# Patient Record
Sex: Female | Born: 1980
Health system: Southern US, Community
[De-identification: ages and names within clinical notes are randomized; demographics above are authoritative.]

## PROBLEM LIST (undated history)

## (undated) DIAGNOSIS — E559 Vitamin D deficiency, unspecified: Secondary | ICD-10-CM

## (undated) DIAGNOSIS — E039 Hypothyroidism, unspecified: Secondary | ICD-10-CM

## (undated) HISTORY — PX: CHOLECYSTECTOMY: SHX55

## (undated) HISTORY — DX: Vitamin D deficiency, unspecified: E55.9

## (undated) HISTORY — PX: CERVICAL FUSION: SHX112

## (undated) HISTORY — DX: Hypothyroidism, unspecified: E03.9

---

## 1998-01-16 ENCOUNTER — Encounter: Admission: RE | Admit: 1998-01-16 | Discharge: 1998-01-16 | Payer: Self-pay | Admitting: Family Medicine

## 1998-02-20 ENCOUNTER — Inpatient Hospital Stay (HOSPITAL_COMMUNITY): Admission: AD | Admit: 1998-02-20 | Discharge: 1998-02-20 | Payer: Self-pay | Admitting: *Deleted

## 1998-02-21 ENCOUNTER — Ambulatory Visit (HOSPITAL_COMMUNITY): Admission: RE | Admit: 1998-02-21 | Discharge: 1998-02-21 | Payer: Self-pay | Admitting: *Deleted

## 1998-05-22 ENCOUNTER — Inpatient Hospital Stay (HOSPITAL_COMMUNITY): Admission: AD | Admit: 1998-05-22 | Discharge: 1998-05-22 | Payer: Self-pay | Admitting: *Deleted

## 1998-06-02 ENCOUNTER — Encounter (HOSPITAL_COMMUNITY): Admission: RE | Admit: 1998-06-02 | Discharge: 1998-08-22 | Payer: Self-pay | Admitting: Obstetrics & Gynecology

## 1998-07-16 ENCOUNTER — Observation Stay (HOSPITAL_COMMUNITY): Admission: AD | Admit: 1998-07-16 | Discharge: 1998-07-17 | Payer: Self-pay | Admitting: Obstetrics & Gynecology

## 1998-08-09 ENCOUNTER — Inpatient Hospital Stay (HOSPITAL_COMMUNITY): Admission: AD | Admit: 1998-08-09 | Discharge: 1998-08-09 | Payer: Self-pay | Admitting: Obstetrics

## 1998-08-22 ENCOUNTER — Inpatient Hospital Stay (HOSPITAL_COMMUNITY): Admission: AD | Admit: 1998-08-22 | Discharge: 1998-08-23 | Payer: Self-pay | Admitting: *Deleted

## 1999-05-01 ENCOUNTER — Ambulatory Visit (HOSPITAL_COMMUNITY): Admission: RE | Admit: 1999-05-01 | Discharge: 1999-05-01 | Payer: Self-pay | Admitting: *Deleted

## 1999-07-30 ENCOUNTER — Inpatient Hospital Stay (HOSPITAL_COMMUNITY): Admission: AD | Admit: 1999-07-30 | Discharge: 1999-07-30 | Payer: Self-pay | Admitting: *Deleted

## 1999-09-02 ENCOUNTER — Inpatient Hospital Stay (HOSPITAL_COMMUNITY): Admission: AD | Admit: 1999-09-02 | Discharge: 1999-09-02 | Payer: Self-pay | Admitting: *Deleted

## 1999-09-03 ENCOUNTER — Inpatient Hospital Stay (HOSPITAL_COMMUNITY): Admission: AD | Admit: 1999-09-03 | Discharge: 1999-09-03 | Payer: Self-pay | Admitting: Obstetrics & Gynecology

## 1999-09-03 ENCOUNTER — Encounter: Payer: Self-pay | Admitting: Obstetrics & Gynecology

## 1999-09-15 ENCOUNTER — Inpatient Hospital Stay (HOSPITAL_COMMUNITY): Admission: AD | Admit: 1999-09-15 | Discharge: 1999-09-15 | Payer: Self-pay | Admitting: *Deleted

## 1999-09-17 ENCOUNTER — Inpatient Hospital Stay (HOSPITAL_COMMUNITY): Admission: AD | Admit: 1999-09-17 | Discharge: 1999-09-19 | Payer: Self-pay | Admitting: *Deleted

## 1999-09-17 ENCOUNTER — Encounter (HOSPITAL_COMMUNITY): Admission: RE | Admit: 1999-09-17 | Discharge: 1999-09-18 | Payer: Self-pay | Admitting: Obstetrics & Gynecology

## 2000-06-08 ENCOUNTER — Emergency Department (HOSPITAL_COMMUNITY): Admission: EM | Admit: 2000-06-08 | Discharge: 2000-06-09 | Payer: Self-pay | Admitting: Emergency Medicine

## 2000-06-09 ENCOUNTER — Encounter: Payer: Self-pay | Admitting: Emergency Medicine

## 2000-08-16 ENCOUNTER — Emergency Department (HOSPITAL_COMMUNITY): Admission: EM | Admit: 2000-08-16 | Discharge: 2000-08-16 | Payer: Self-pay | Admitting: Emergency Medicine

## 2000-10-20 ENCOUNTER — Emergency Department (HOSPITAL_COMMUNITY): Admission: EM | Admit: 2000-10-20 | Discharge: 2000-10-21 | Payer: Self-pay | Admitting: Emergency Medicine

## 2000-11-10 ENCOUNTER — Encounter: Payer: Self-pay | Admitting: Emergency Medicine

## 2000-11-10 ENCOUNTER — Emergency Department (HOSPITAL_COMMUNITY): Admission: EM | Admit: 2000-11-10 | Discharge: 2000-11-10 | Payer: Self-pay | Admitting: Emergency Medicine

## 2001-01-06 ENCOUNTER — Emergency Department (HOSPITAL_COMMUNITY): Admission: EM | Admit: 2001-01-06 | Discharge: 2001-01-06 | Payer: Self-pay | Admitting: Emergency Medicine

## 2001-01-06 ENCOUNTER — Encounter: Payer: Self-pay | Admitting: Emergency Medicine

## 2001-04-14 ENCOUNTER — Emergency Department (HOSPITAL_COMMUNITY): Admission: EM | Admit: 2001-04-14 | Discharge: 2001-04-14 | Payer: Self-pay | Admitting: Internal Medicine

## 2001-11-17 ENCOUNTER — Emergency Department (HOSPITAL_COMMUNITY): Admission: EM | Admit: 2001-11-17 | Discharge: 2001-11-17 | Payer: Self-pay | Admitting: *Deleted

## 2001-11-18 ENCOUNTER — Emergency Department (HOSPITAL_COMMUNITY): Admission: EM | Admit: 2001-11-18 | Discharge: 2001-11-18 | Payer: Self-pay | Admitting: Emergency Medicine

## 2002-01-13 ENCOUNTER — Emergency Department (HOSPITAL_COMMUNITY): Admission: EM | Admit: 2002-01-13 | Discharge: 2002-01-13 | Payer: Self-pay | Admitting: Emergency Medicine

## 2002-01-13 ENCOUNTER — Encounter: Payer: Self-pay | Admitting: Emergency Medicine

## 2002-01-16 ENCOUNTER — Emergency Department (HOSPITAL_COMMUNITY): Admission: EM | Admit: 2002-01-16 | Discharge: 2002-01-16 | Payer: Self-pay | Admitting: Emergency Medicine

## 2002-01-16 ENCOUNTER — Encounter: Payer: Self-pay | Admitting: Emergency Medicine

## 2002-04-21 ENCOUNTER — Emergency Department (HOSPITAL_COMMUNITY): Admission: EM | Admit: 2002-04-21 | Discharge: 2002-04-21 | Payer: Self-pay | Admitting: Emergency Medicine

## 2002-05-25 ENCOUNTER — Emergency Department (HOSPITAL_COMMUNITY): Admission: EM | Admit: 2002-05-25 | Discharge: 2002-05-25 | Payer: Self-pay | Admitting: Emergency Medicine

## 2002-10-20 ENCOUNTER — Encounter: Payer: Self-pay | Admitting: *Deleted

## 2002-10-20 ENCOUNTER — Emergency Department (HOSPITAL_COMMUNITY): Admission: EM | Admit: 2002-10-20 | Discharge: 2002-10-20 | Payer: Self-pay | Admitting: *Deleted

## 2002-10-26 ENCOUNTER — Encounter: Admission: RE | Admit: 2002-10-26 | Discharge: 2002-10-26 | Payer: Self-pay | Admitting: Family Medicine

## 2002-11-02 ENCOUNTER — Encounter: Admission: RE | Admit: 2002-11-02 | Discharge: 2002-11-02 | Payer: Self-pay | Admitting: Family Medicine

## 2002-11-03 ENCOUNTER — Encounter: Admission: RE | Admit: 2002-11-03 | Discharge: 2002-11-03 | Payer: Self-pay | Admitting: Family Medicine

## 2002-11-04 ENCOUNTER — Encounter: Admission: RE | Admit: 2002-11-04 | Discharge: 2002-11-04 | Payer: Self-pay | Admitting: Family Medicine

## 2003-01-19 ENCOUNTER — Emergency Department (HOSPITAL_COMMUNITY): Admission: EM | Admit: 2003-01-19 | Discharge: 2003-01-19 | Payer: Self-pay | Admitting: Emergency Medicine

## 2003-01-21 ENCOUNTER — Emergency Department (HOSPITAL_COMMUNITY): Admission: EM | Admit: 2003-01-21 | Discharge: 2003-01-21 | Payer: Self-pay | Admitting: Emergency Medicine

## 2003-01-24 ENCOUNTER — Encounter: Admission: RE | Admit: 2003-01-24 | Discharge: 2003-01-24 | Payer: Self-pay | Admitting: Family Medicine

## 2003-06-16 ENCOUNTER — Encounter: Payer: Self-pay | Admitting: Obstetrics & Gynecology

## 2003-06-16 ENCOUNTER — Inpatient Hospital Stay (HOSPITAL_COMMUNITY): Admission: AD | Admit: 2003-06-16 | Discharge: 2003-06-16 | Payer: Self-pay | Admitting: Obstetrics & Gynecology

## 2003-06-18 ENCOUNTER — Inpatient Hospital Stay (HOSPITAL_COMMUNITY): Admission: AD | Admit: 2003-06-18 | Discharge: 2003-06-18 | Payer: Self-pay | Admitting: Obstetrics & Gynecology

## 2003-07-09 ENCOUNTER — Encounter: Payer: Self-pay | Admitting: *Deleted

## 2003-07-09 ENCOUNTER — Inpatient Hospital Stay (HOSPITAL_COMMUNITY): Admission: AD | Admit: 2003-07-09 | Discharge: 2003-07-09 | Payer: Self-pay | Admitting: *Deleted

## 2003-08-24 ENCOUNTER — Ambulatory Visit (HOSPITAL_COMMUNITY): Admission: RE | Admit: 2003-08-24 | Discharge: 2003-08-24 | Payer: Self-pay | Admitting: *Deleted

## 2003-09-21 ENCOUNTER — Ambulatory Visit (HOSPITAL_COMMUNITY): Admission: RE | Admit: 2003-09-21 | Discharge: 2003-09-21 | Payer: Self-pay | Admitting: *Deleted

## 2003-12-20 ENCOUNTER — Ambulatory Visit (HOSPITAL_COMMUNITY): Admission: RE | Admit: 2003-12-20 | Discharge: 2003-12-20 | Payer: Self-pay | Admitting: *Deleted

## 2004-01-23 ENCOUNTER — Emergency Department (HOSPITAL_COMMUNITY): Admission: EM | Admit: 2004-01-23 | Discharge: 2004-01-23 | Payer: Self-pay | Admitting: Family Medicine

## 2004-02-09 ENCOUNTER — Observation Stay (HOSPITAL_COMMUNITY): Admission: AD | Admit: 2004-02-09 | Discharge: 2004-02-10 | Payer: Self-pay | Admitting: Family Medicine

## 2004-02-13 ENCOUNTER — Inpatient Hospital Stay (HOSPITAL_COMMUNITY): Admission: AD | Admit: 2004-02-13 | Discharge: 2004-02-13 | Payer: Self-pay | Admitting: Obstetrics and Gynecology

## 2004-02-17 ENCOUNTER — Inpatient Hospital Stay (HOSPITAL_COMMUNITY): Admission: AD | Admit: 2004-02-17 | Discharge: 2004-02-19 | Payer: Self-pay | Admitting: Obstetrics & Gynecology

## 2004-03-12 ENCOUNTER — Inpatient Hospital Stay (HOSPITAL_COMMUNITY): Admission: AD | Admit: 2004-03-12 | Discharge: 2004-03-12 | Payer: Self-pay | Admitting: Obstetrics & Gynecology

## 2004-03-20 ENCOUNTER — Encounter: Admission: RE | Admit: 2004-03-20 | Discharge: 2004-03-20 | Payer: Self-pay | Admitting: Obstetrics and Gynecology

## 2004-04-02 IMAGING — US US OB FOLLOW-UP
1 series · 13 of 28 positions shown · non-contrast
Comparison: none

CLINICAL DATA: G4 P3.  LMP 05/15/03.  Evaluate anatomy.

[Series 1: unknown · 0.35mm/px · 13 of 70 slices shown]
[im 3/70]
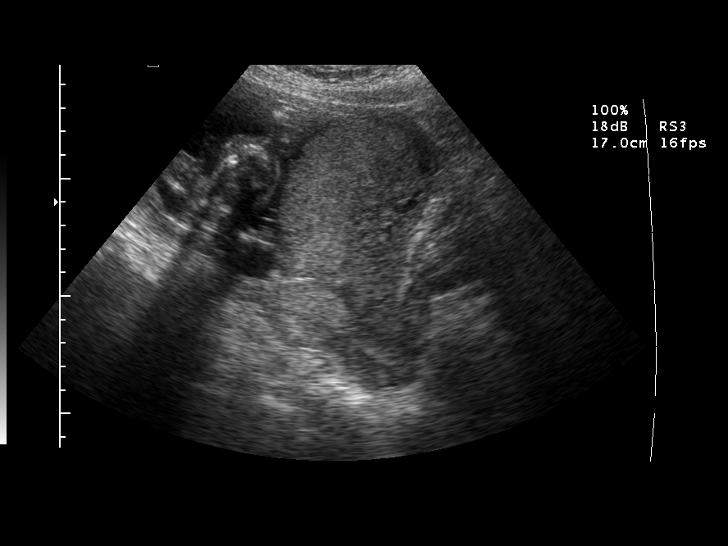
[im 8/70]
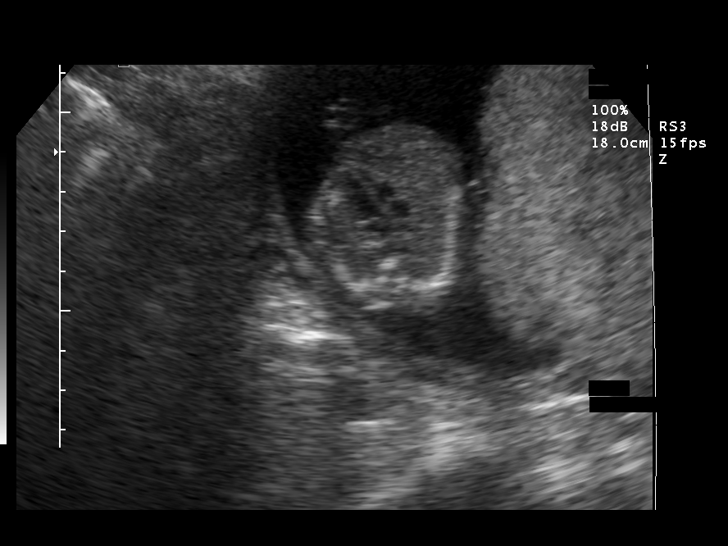
[im 13/70]
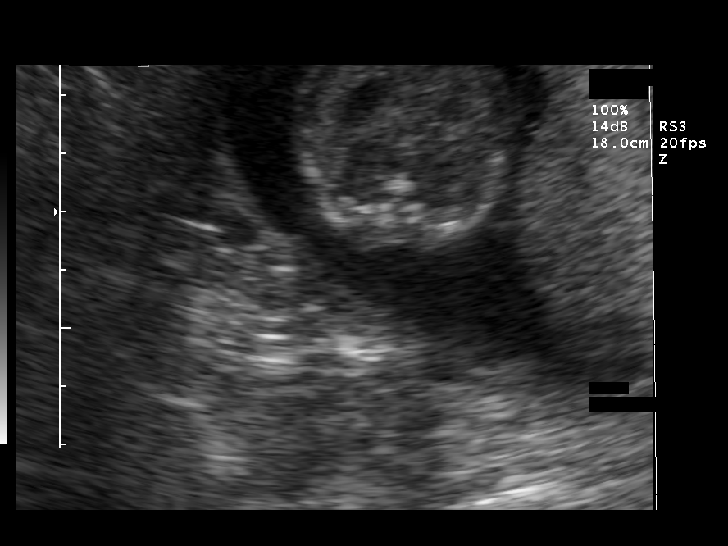
[im 18/70]
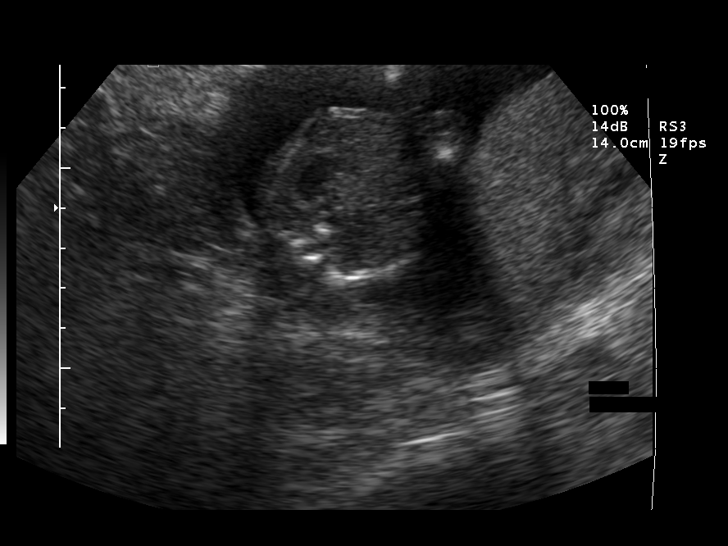
[im 24/70]
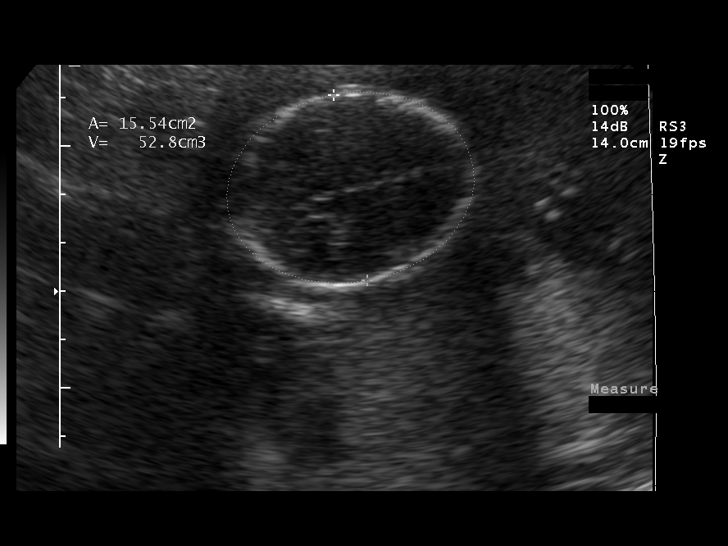
[im 29/70]
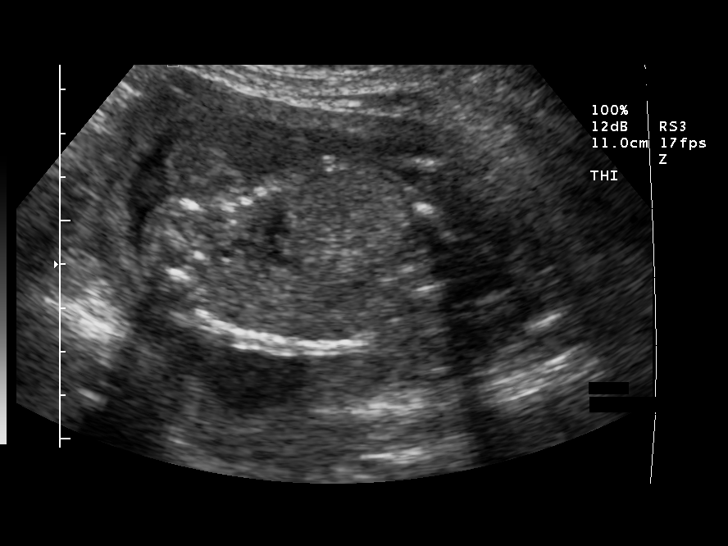
[im 36/70]
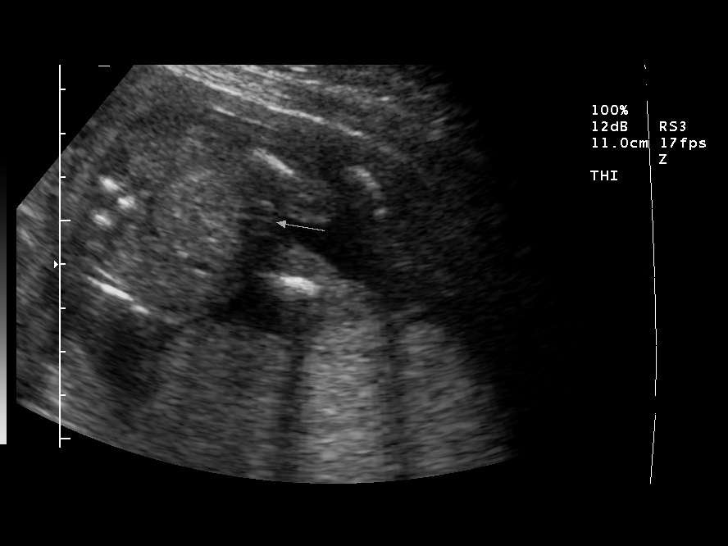
[im 41/70]
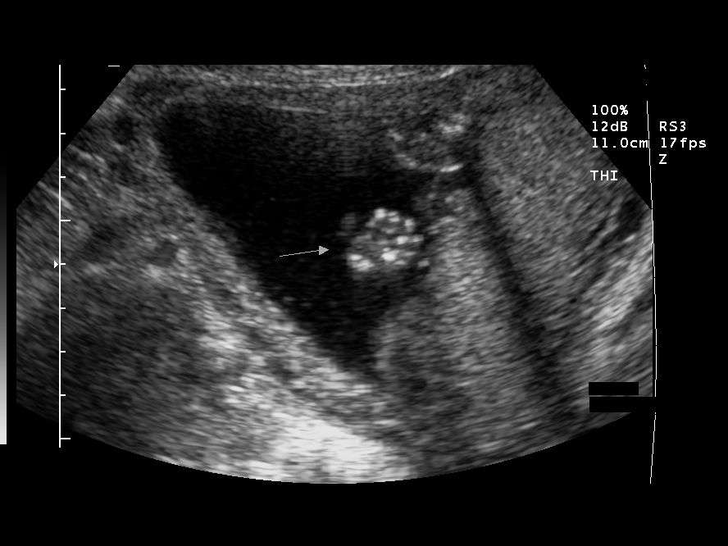
[im 47/70]
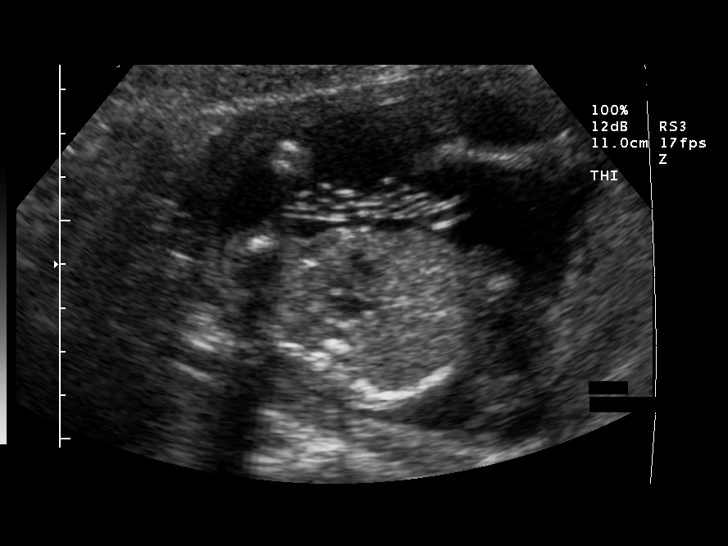
[im 52/70]
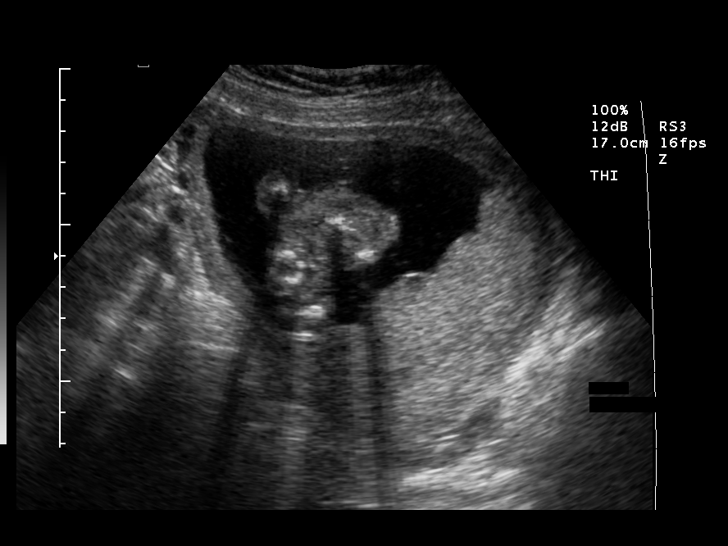
[im 57/70]
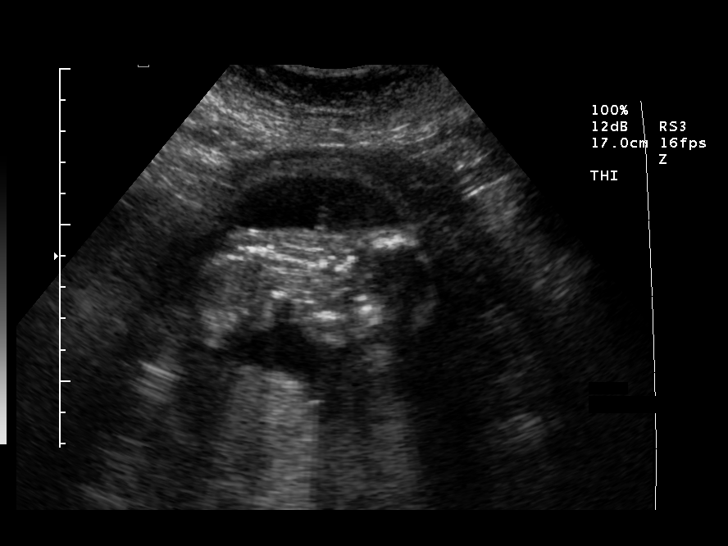
[im 62/70]
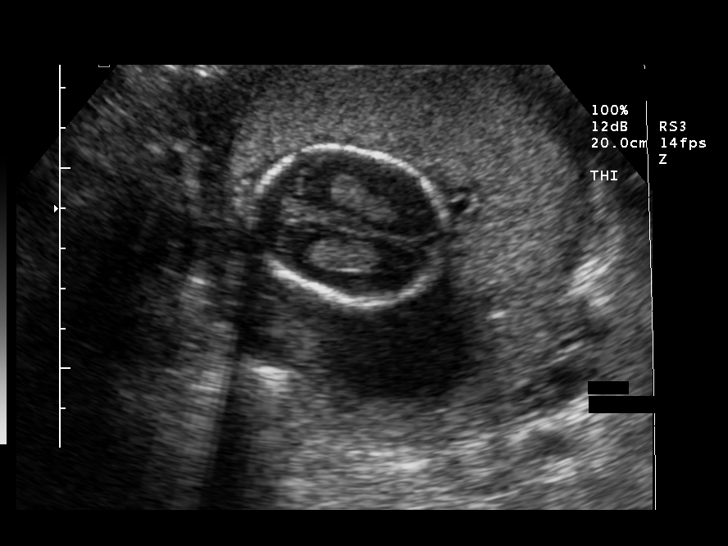
[im 67/70]
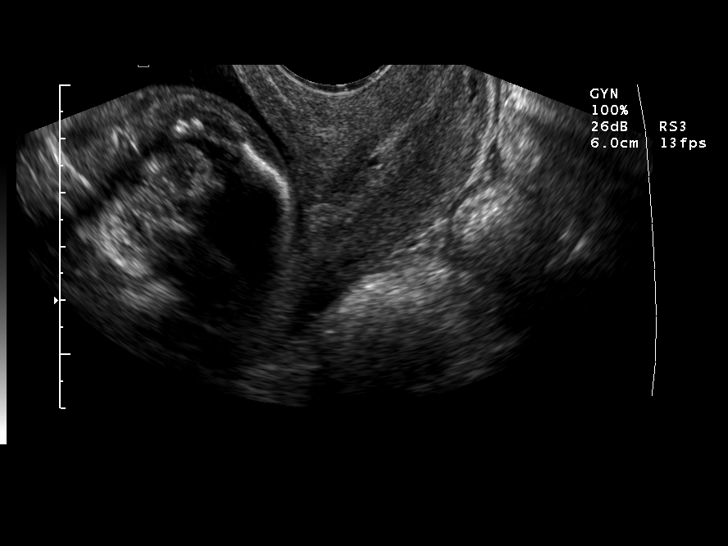

[13 of 28 positions shown; findings below may reference images not displayed]

OBSTETRICAL ULTRASOUND RE-EVALUATION WITH TRANSVAGINAL

NUMBER OF FETUSES:  1
HEART RATE:  135
MOVEMENT:  Yes
BREATHING:  No
PRESENTATION:
PLACENTAL LOCATION:  Anterior, left lateral 
GRADE:  I
PREVIA:  No
AMNIOTIC FLUID (subjective):  Normal
AMNIOTIC FLUID (objective):  4.2 cm Vertical pocket 

FETAL BIOMETRY
BPD:  3.9 cm  17 w 5 d
HC:  14.6 cm  17 w 5 d 
AC:   13.6 cm   19 w 0 d
FL:  2.5 cm   17 w 5 d
MEAN GA:  18 w 0 d
GA BY LMP;  18 w 3 d (Assigned)

FETAL ANATOMY
LATERAL VENTRICLES:    Visualized 
THALAMI/CSP:  Visualized 
POSTERIOR FOSSA:  Visualized 
NUCHAL REGION:  Visualized 
SPINE:  
4 CHAMBER HEART ON LEFT:  Visualized 
STOMACH ON LEFT:  Visualized 
3 VESSEL CORD:  Visualized 
CORD INSERTION SITE:  Visualized 
KIDNEYS:  Visualized 
BLADDER:    Visualized 
EXTREMITIES:  Previously seen 

ADDITIONAL ANATOMY VISUALIZED:  RVOT, upper lip, orbits, profile, diaphragm, heel, 5th digit, ductal arch, aortic arch, and female genitalia

MATERNAL FINDINGS
CERVIX:  4.0 cm Transvaginally
IMPRESSION: Single living intrauterine fetus in transverse lie.  Patient is 18 weeks 3 days by LMP dating and measures 18 weeks today indicating appropriate growth.
Longitudinal cervical spine and LVOT not visualized.  Otherwise, no anatomic abnormality noted.

## 2004-06-13 ENCOUNTER — Emergency Department (HOSPITAL_COMMUNITY): Admission: EM | Admit: 2004-06-13 | Discharge: 2004-06-13 | Payer: Self-pay | Admitting: Family Medicine

## 2004-06-14 ENCOUNTER — Ambulatory Visit (HOSPITAL_COMMUNITY): Admission: RE | Admit: 2004-06-14 | Discharge: 2004-06-14 | Payer: Self-pay | Admitting: Family Medicine

## 2004-08-02 ENCOUNTER — Ambulatory Visit: Payer: Self-pay | Admitting: Endocrinology

## 2004-08-02 ENCOUNTER — Ambulatory Visit: Payer: Self-pay | Admitting: Internal Medicine

## 2004-08-15 ENCOUNTER — Emergency Department (HOSPITAL_COMMUNITY): Admission: EM | Admit: 2004-08-15 | Discharge: 2004-08-15 | Payer: Self-pay | Admitting: Family Medicine

## 2004-09-17 ENCOUNTER — Ambulatory Visit: Payer: Self-pay | Admitting: Endocrinology

## 2004-09-17 ENCOUNTER — Ambulatory Visit: Payer: Self-pay | Admitting: Internal Medicine

## 2004-10-24 ENCOUNTER — Ambulatory Visit: Payer: Self-pay | Admitting: Endocrinology

## 2004-12-12 ENCOUNTER — Ambulatory Visit: Payer: Self-pay | Admitting: Endocrinology

## 2004-12-18 ENCOUNTER — Ambulatory Visit: Payer: Self-pay | Admitting: Internal Medicine

## 2004-12-19 ENCOUNTER — Ambulatory Visit (HOSPITAL_COMMUNITY): Admission: RE | Admit: 2004-12-19 | Discharge: 2004-12-19 | Payer: Self-pay | Admitting: Internal Medicine

## 2004-12-25 ENCOUNTER — Ambulatory Visit: Payer: Self-pay | Admitting: Internal Medicine

## 2005-01-04 ENCOUNTER — Ambulatory Visit: Payer: Self-pay | Admitting: Internal Medicine

## 2005-01-09 ENCOUNTER — Ambulatory Visit: Payer: Self-pay | Admitting: Internal Medicine

## 2005-02-05 ENCOUNTER — Ambulatory Visit: Payer: Self-pay | Admitting: Internal Medicine

## 2005-02-26 ENCOUNTER — Emergency Department (HOSPITAL_COMMUNITY): Admission: EM | Admit: 2005-02-26 | Discharge: 2005-02-26 | Payer: Self-pay | Admitting: Family Medicine

## 2005-04-04 ENCOUNTER — Ambulatory Visit: Payer: Self-pay | Admitting: Internal Medicine

## 2005-04-28 ENCOUNTER — Emergency Department (HOSPITAL_COMMUNITY): Admission: EM | Admit: 2005-04-28 | Discharge: 2005-04-28 | Payer: Self-pay | Admitting: Family Medicine

## 2005-04-30 ENCOUNTER — Inpatient Hospital Stay (HOSPITAL_COMMUNITY): Admission: AD | Admit: 2005-04-30 | Discharge: 2005-04-30 | Payer: Self-pay | Admitting: Obstetrics & Gynecology

## 2005-06-28 ENCOUNTER — Ambulatory Visit: Payer: Self-pay | Admitting: Internal Medicine

## 2005-07-08 ENCOUNTER — Emergency Department (HOSPITAL_COMMUNITY): Admission: EM | Admit: 2005-07-08 | Discharge: 2005-07-08 | Payer: Self-pay | Admitting: Emergency Medicine

## 2005-07-09 ENCOUNTER — Ambulatory Visit: Payer: Self-pay | Admitting: Internal Medicine

## 2005-08-11 ENCOUNTER — Inpatient Hospital Stay (HOSPITAL_COMMUNITY): Admission: AD | Admit: 2005-08-11 | Discharge: 2005-08-11 | Payer: Self-pay | Admitting: Obstetrics and Gynecology

## 2005-08-12 ENCOUNTER — Ambulatory Visit: Payer: Self-pay | Admitting: Internal Medicine

## 2005-08-13 ENCOUNTER — Encounter: Admission: RE | Admit: 2005-08-13 | Discharge: 2005-08-13 | Payer: Self-pay | Admitting: Internal Medicine

## 2005-09-10 ENCOUNTER — Ambulatory Visit: Payer: Self-pay | Admitting: Internal Medicine

## 2005-11-18 ENCOUNTER — Ambulatory Visit: Payer: Self-pay | Admitting: Internal Medicine

## 2005-11-23 ENCOUNTER — Emergency Department (HOSPITAL_COMMUNITY): Admission: EM | Admit: 2005-11-23 | Discharge: 2005-11-23 | Payer: Self-pay | Admitting: Family Medicine

## 2005-11-29 ENCOUNTER — Ambulatory Visit: Payer: Self-pay | Admitting: Internal Medicine

## 2005-12-10 ENCOUNTER — Encounter: Admission: RE | Admit: 2005-12-10 | Discharge: 2005-12-10 | Payer: Self-pay | Admitting: Internal Medicine

## 2005-12-29 ENCOUNTER — Emergency Department (HOSPITAL_COMMUNITY): Admission: EM | Admit: 2005-12-29 | Discharge: 2005-12-29 | Payer: Self-pay | Admitting: Emergency Medicine

## 2006-01-14 ENCOUNTER — Ambulatory Visit: Payer: Self-pay | Admitting: Endocrinology

## 2006-02-13 ENCOUNTER — Encounter: Payer: Self-pay | Admitting: Obstetrics and Gynecology

## 2006-02-13 ENCOUNTER — Ambulatory Visit: Payer: Self-pay | Admitting: Obstetrics and Gynecology

## 2006-02-21 ENCOUNTER — Ambulatory Visit: Payer: Self-pay | Admitting: Internal Medicine

## 2006-03-24 ENCOUNTER — Ambulatory Visit: Payer: Self-pay | Admitting: Internal Medicine

## 2006-05-08 ENCOUNTER — Ambulatory Visit: Payer: Self-pay | Admitting: Endocrinology

## 2006-07-15 ENCOUNTER — Ambulatory Visit: Payer: Self-pay | Admitting: Endocrinology

## 2006-07-30 ENCOUNTER — Emergency Department (HOSPITAL_COMMUNITY): Admission: EM | Admit: 2006-07-30 | Discharge: 2006-07-30 | Payer: Self-pay | Admitting: Family Medicine

## 2006-08-19 ENCOUNTER — Ambulatory Visit: Payer: Self-pay | Admitting: Internal Medicine

## 2006-09-10 ENCOUNTER — Emergency Department (HOSPITAL_COMMUNITY): Admission: EM | Admit: 2006-09-10 | Discharge: 2006-09-10 | Payer: Self-pay | Admitting: Family Medicine

## 2006-09-10 ENCOUNTER — Ambulatory Visit: Payer: Self-pay | Admitting: Internal Medicine

## 2006-09-10 LAB — CONVERTED CEMR LAB
ALT: 23 units/L (ref 0–40)
AST: 33 units/L (ref 0–37)
Amylase: 66 units/L (ref 27–131)
BUN: 12 mg/dL (ref 6–23)
Bacteria, U Microscopic: NEGATIVE /hpf
Basophils Absolute: 0 10*3/uL (ref 0.0–0.1)
Basophils Relative: 0.5 % (ref 0.0–1.0)
Bilirubin Urine: NEGATIVE
Creatinine, Ser: 0.8 mg/dL (ref 0.4–1.2)
Crystals: NEGATIVE
Eosinophil percent: 2.2 % (ref 0.0–5.0)
Glucose, Bld: 73 mg/dL (ref 70–99)
HCT: 34.2 % — ABNORMAL LOW (ref 36.0–46.0)
Hemoglobin, Urine: NEGATIVE
Hemoglobin: 11.9 g/dL — ABNORMAL LOW (ref 12.0–15.0)
Ketones, ur: NEGATIVE mg/dL
Lipase: 34 units/L (ref 11.0–59.0)
Lymphocytes Relative: 20.6 % (ref 12.0–46.0)
MCHC: 34.8 g/dL (ref 30.0–36.0)
MCV: 85.3 fL (ref 78.0–100.0)
Monocytes Absolute: 0.4 10*3/uL (ref 0.2–0.7)
Monocytes Relative: 5.8 % (ref 3.0–11.0)
Mucus, UA: NEGATIVE
Neutro Abs: 5.3 10*3/uL (ref 1.4–7.7)
Neutrophils Relative %: 70.9 % (ref 43.0–77.0)
Nitrite: NEGATIVE
Platelets: 210 10*3/uL (ref 150–400)
Potassium: 3.6 meq/L (ref 3.5–5.1)
RBC / HPF: NONE SEEN
RBC: 4.01 M/uL (ref 3.87–5.11)
RDW: 11.7 % (ref 11.5–14.6)
Sodium: 139 meq/L (ref 135–145)
Specific Gravity, Urine: 1.01 (ref 1.000–1.03)
TSH: 4.95 microintl units/mL (ref 0.35–5.50)
Total Protein, Urine: NEGATIVE mg/dL
Urine Glucose: NEGATIVE mg/dL
Urobilinogen, UA: 0.2 (ref 0.0–1.0)
WBC: 7.4 10*3/uL (ref 4.5–10.5)
pH: 7 (ref 5.0–8.0)

## 2006-09-18 ENCOUNTER — Ambulatory Visit: Payer: Self-pay | Admitting: Gastroenterology

## 2006-10-10 ENCOUNTER — Emergency Department (HOSPITAL_COMMUNITY): Admission: EM | Admit: 2006-10-10 | Discharge: 2006-10-10 | Payer: Self-pay | Admitting: Emergency Medicine

## 2006-10-20 ENCOUNTER — Ambulatory Visit: Payer: Self-pay | Admitting: Internal Medicine

## 2006-11-27 DIAGNOSIS — F410 Panic disorder [episodic paroxysmal anxiety] without agoraphobia: Secondary | ICD-10-CM

## 2006-11-27 DIAGNOSIS — F411 Generalized anxiety disorder: Secondary | ICD-10-CM | POA: Insufficient documentation

## 2007-01-22 ENCOUNTER — Ambulatory Visit: Payer: Self-pay | Admitting: Internal Medicine

## 2007-02-19 ENCOUNTER — Ambulatory Visit: Payer: Self-pay | Admitting: Internal Medicine

## 2007-02-19 LAB — CONVERTED CEMR LAB
ALT: 17 units/L (ref 0–40)
AST: 17 units/L (ref 0–37)
Albumin: 3.8 g/dL (ref 3.5–5.2)
Alkaline Phosphatase: 48 units/L (ref 39–117)
BUN: 7 mg/dL (ref 6–23)
Basophils Absolute: 0.1 10*3/uL (ref 0.0–0.1)
Basophils Relative: 1.1 % — ABNORMAL HIGH (ref 0.0–1.0)
Bilirubin, Direct: 0.1 mg/dL (ref 0.0–0.3)
CO2: 30 meq/L (ref 19–32)
Calcium: 9.1 mg/dL (ref 8.4–10.5)
Chloride: 108 meq/L (ref 96–112)
Creatinine, Ser: 0.9 mg/dL (ref 0.4–1.2)
Eosinophils Absolute: 0.3 10*3/uL (ref 0.0–0.6)
Eosinophils Relative: 4 % (ref 0.0–5.0)
GFR calc Af Amer: 97 mL/min
GFR calc non Af Amer: 80 mL/min
Glucose, Bld: 87 mg/dL (ref 70–99)
HCT: 36.6 % (ref 36.0–46.0)
Hemoglobin: 12.5 g/dL (ref 12.0–15.0)
Lymphocytes Relative: 25.3 % (ref 12.0–46.0)
MCHC: 34.3 g/dL (ref 30.0–36.0)
MCV: 86.2 fL (ref 78.0–100.0)
Monocytes Absolute: 0.3 10*3/uL (ref 0.2–0.7)
Monocytes Relative: 5.3 % (ref 3.0–11.0)
Neutro Abs: 4.2 10*3/uL (ref 1.4–7.7)
Neutrophils Relative %: 64.3 % (ref 43.0–77.0)
Platelets: 189 10*3/uL (ref 150–400)
Potassium: 3.8 meq/L (ref 3.5–5.1)
RBC: 4.24 M/uL (ref 3.87–5.11)
RDW: 12 % (ref 11.5–14.6)
Sodium: 142 meq/L (ref 135–145)
TSH: 5.18 microintl units/mL (ref 0.35–5.50)
Total Bilirubin: 0.7 mg/dL (ref 0.3–1.2)
Total Protein: 7 g/dL (ref 6.0–8.3)
WBC: 6.6 10*3/uL (ref 4.5–10.5)

## 2007-02-24 ENCOUNTER — Emergency Department (HOSPITAL_COMMUNITY): Admission: EM | Admit: 2007-02-24 | Discharge: 2007-02-24 | Payer: Self-pay | Admitting: Family Medicine

## 2007-03-12 ENCOUNTER — Encounter: Payer: Self-pay | Admitting: Endocrinology

## 2007-04-15 ENCOUNTER — Ambulatory Visit: Payer: Self-pay | Admitting: Internal Medicine

## 2007-05-04 ENCOUNTER — Emergency Department (HOSPITAL_COMMUNITY): Admission: EM | Admit: 2007-05-04 | Discharge: 2007-05-04 | Payer: Self-pay | Admitting: Emergency Medicine

## 2007-05-12 ENCOUNTER — Encounter: Payer: Self-pay | Admitting: Endocrinology

## 2007-05-12 DIAGNOSIS — F988 Other specified behavioral and emotional disorders with onset usually occurring in childhood and adolescence: Secondary | ICD-10-CM | POA: Insufficient documentation

## 2007-05-21 ENCOUNTER — Ambulatory Visit: Payer: Self-pay | Admitting: Internal Medicine

## 2007-05-29 ENCOUNTER — Ambulatory Visit (HOSPITAL_COMMUNITY): Admission: RE | Admit: 2007-05-29 | Discharge: 2007-05-29 | Payer: Self-pay | Admitting: Surgery

## 2007-05-29 ENCOUNTER — Encounter (INDEPENDENT_AMBULATORY_CARE_PROVIDER_SITE_OTHER): Payer: Self-pay | Admitting: Surgery

## 2007-06-25 ENCOUNTER — Ambulatory Visit: Payer: Self-pay | Admitting: Internal Medicine

## 2007-06-27 DIAGNOSIS — K219 Gastro-esophageal reflux disease without esophagitis: Secondary | ICD-10-CM | POA: Insufficient documentation

## 2007-07-10 ENCOUNTER — Encounter: Payer: Self-pay | Admitting: Internal Medicine

## 2007-07-10 ENCOUNTER — Ambulatory Visit: Payer: Self-pay | Admitting: Internal Medicine

## 2007-07-10 DIAGNOSIS — F329 Major depressive disorder, single episode, unspecified: Secondary | ICD-10-CM

## 2007-07-10 DIAGNOSIS — E039 Hypothyroidism, unspecified: Secondary | ICD-10-CM | POA: Insufficient documentation

## 2007-07-10 DIAGNOSIS — N61 Mastitis without abscess: Secondary | ICD-10-CM | POA: Insufficient documentation

## 2007-07-22 ENCOUNTER — Telehealth: Payer: Self-pay | Admitting: Internal Medicine

## 2007-08-24 ENCOUNTER — Telehealth: Payer: Self-pay | Admitting: Internal Medicine

## 2007-09-14 ENCOUNTER — Ambulatory Visit: Payer: Self-pay | Admitting: Internal Medicine

## 2007-09-14 DIAGNOSIS — J45909 Unspecified asthma, uncomplicated: Secondary | ICD-10-CM | POA: Insufficient documentation

## 2007-09-14 DIAGNOSIS — J069 Acute upper respiratory infection, unspecified: Secondary | ICD-10-CM | POA: Insufficient documentation

## 2007-09-25 ENCOUNTER — Emergency Department (HOSPITAL_COMMUNITY): Admission: EM | Admit: 2007-09-25 | Discharge: 2007-09-25 | Payer: Self-pay | Admitting: Emergency Medicine

## 2007-10-05 ENCOUNTER — Encounter: Payer: Self-pay | Admitting: Internal Medicine

## 2007-11-12 ENCOUNTER — Encounter: Payer: Self-pay | Admitting: Internal Medicine

## 2007-11-25 ENCOUNTER — Telehealth (INDEPENDENT_AMBULATORY_CARE_PROVIDER_SITE_OTHER): Payer: Self-pay | Admitting: *Deleted

## 2007-12-01 ENCOUNTER — Encounter: Payer: Self-pay | Admitting: Internal Medicine

## 2007-12-09 IMAGING — RF DG CHOLANGIOGRAM OPERATIVE
1 series · 4 of 4 positions shown · non-contrast
Comparison: abdomen ultrasound 09/18/2006.

CLINICAL DATA: Chronic cholecystitis.
INTRAOPERATIVE CHOLANGIOGRAM:

[Series 1: run · 4 of 61 frames shown]
[frame 2/61]
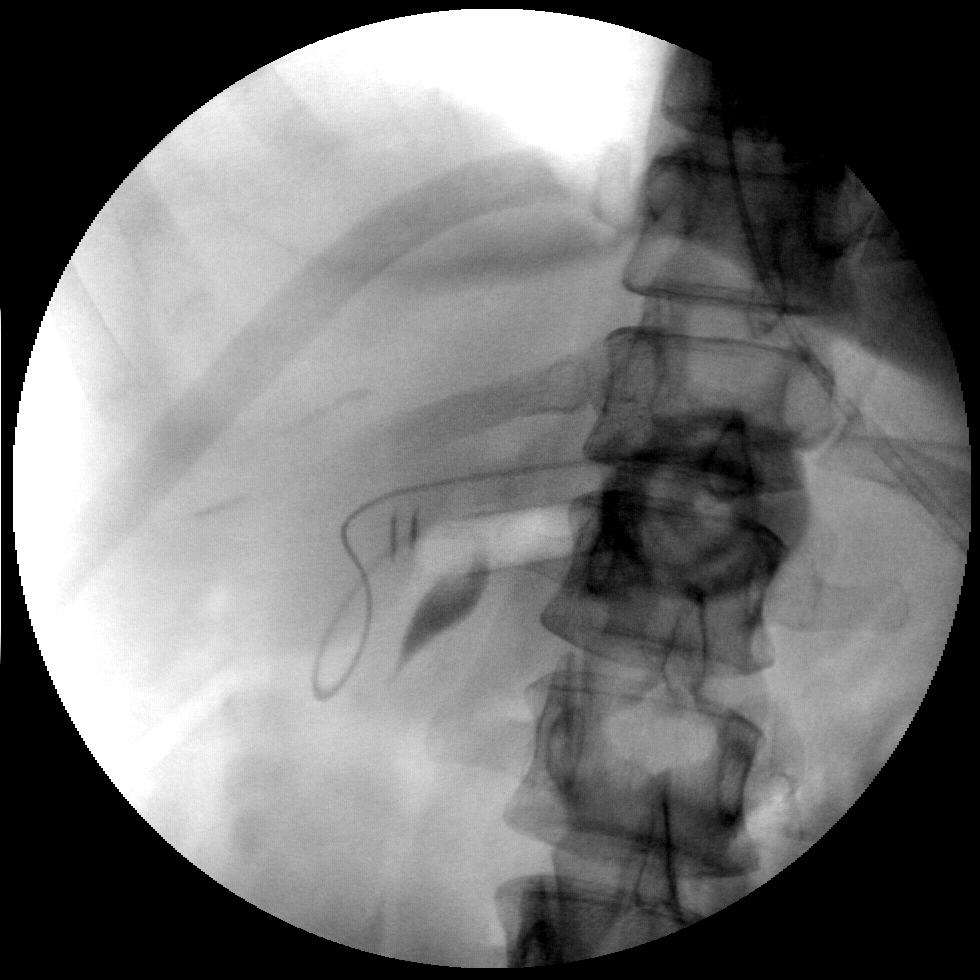
[frame 10/61]
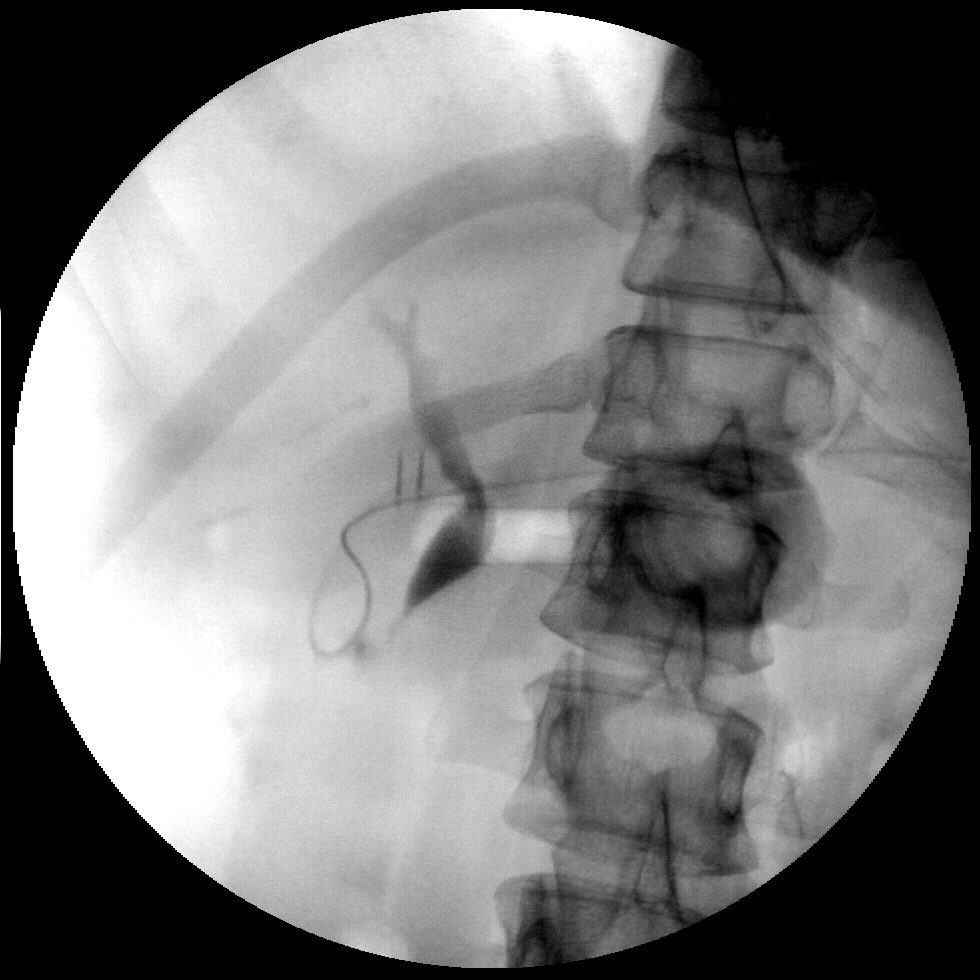
[frame 31/61]
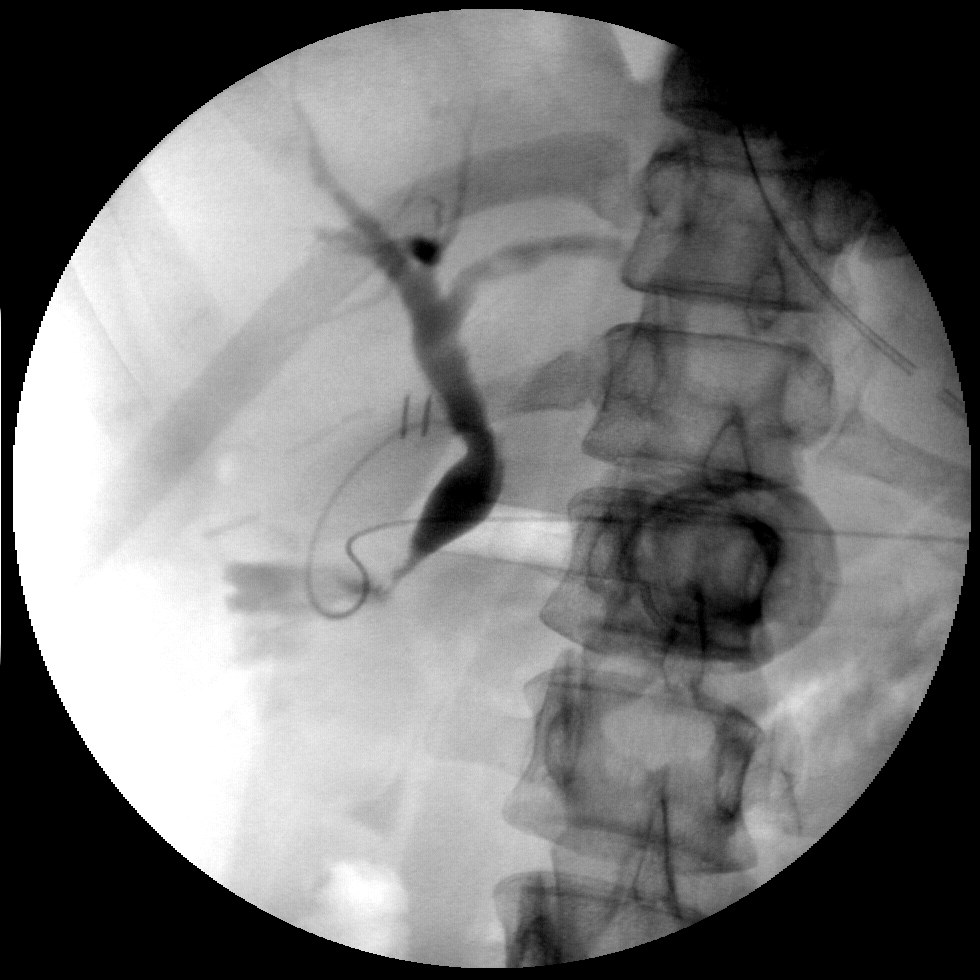
[frame 52/61]
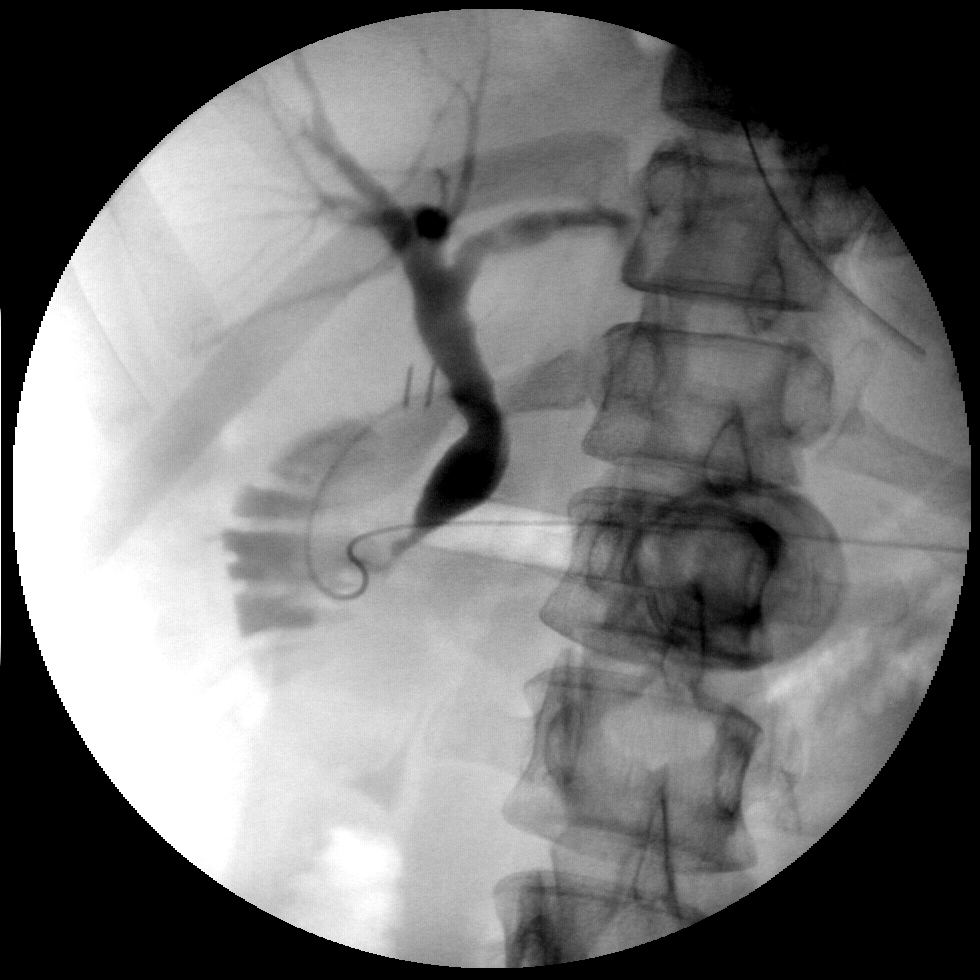

[4 of 4 positions shown; findings below may reference images not displayed]

FINDINGS: The study is somewhat motion limited by breathing artifact.  Mild fusiform prominence of the common bile duct appears unchanged from the prior ultrasound.  There is drainage into the duodenum and no extravasation.  No retained calculi are demonstrated.  A suggested lucency of the left intrahepatic ducts appears to be due to overlapping ducts but is not well evaluated due to breathing and cardiac motion.
IMPRESSION: No evidence of ductal obstruction or retained calculi.

## 2007-12-24 ENCOUNTER — Telehealth: Payer: Self-pay | Admitting: Internal Medicine

## 2007-12-25 ENCOUNTER — Encounter: Payer: Self-pay | Admitting: Internal Medicine

## 2008-01-19 ENCOUNTER — Encounter: Admission: RE | Admit: 2008-01-19 | Discharge: 2008-01-19 | Payer: Self-pay | Admitting: Family Medicine

## 2008-02-01 ENCOUNTER — Telehealth: Payer: Self-pay | Admitting: Internal Medicine

## 2008-02-02 ENCOUNTER — Encounter: Payer: Self-pay | Admitting: Internal Medicine

## 2008-02-05 ENCOUNTER — Ambulatory Visit: Payer: Self-pay | Admitting: Internal Medicine

## 2008-02-05 DIAGNOSIS — J209 Acute bronchitis, unspecified: Secondary | ICD-10-CM

## 2008-02-05 DIAGNOSIS — R21 Rash and other nonspecific skin eruption: Secondary | ICD-10-CM | POA: Insufficient documentation

## 2009-08-13 ENCOUNTER — Emergency Department (HOSPITAL_COMMUNITY): Admission: EM | Admit: 2009-08-13 | Discharge: 2009-08-13 | Payer: Self-pay | Admitting: Emergency Medicine

## 2009-08-30 ENCOUNTER — Emergency Department (HOSPITAL_COMMUNITY): Admission: EM | Admit: 2009-08-30 | Discharge: 2009-08-30 | Payer: Self-pay | Admitting: Family Medicine

## 2010-03-12 IMAGING — CR DG TIBIA/FIBULA 2V*R*
2 series · 2 of 2 positions shown · non-contrast
Comparison: None

CLINICAL DATA: Not on lower leg, no acute injury

RIGHT TIBIA AND FIBULA - 2 VIEW

[view not recorded (1 of 2)]
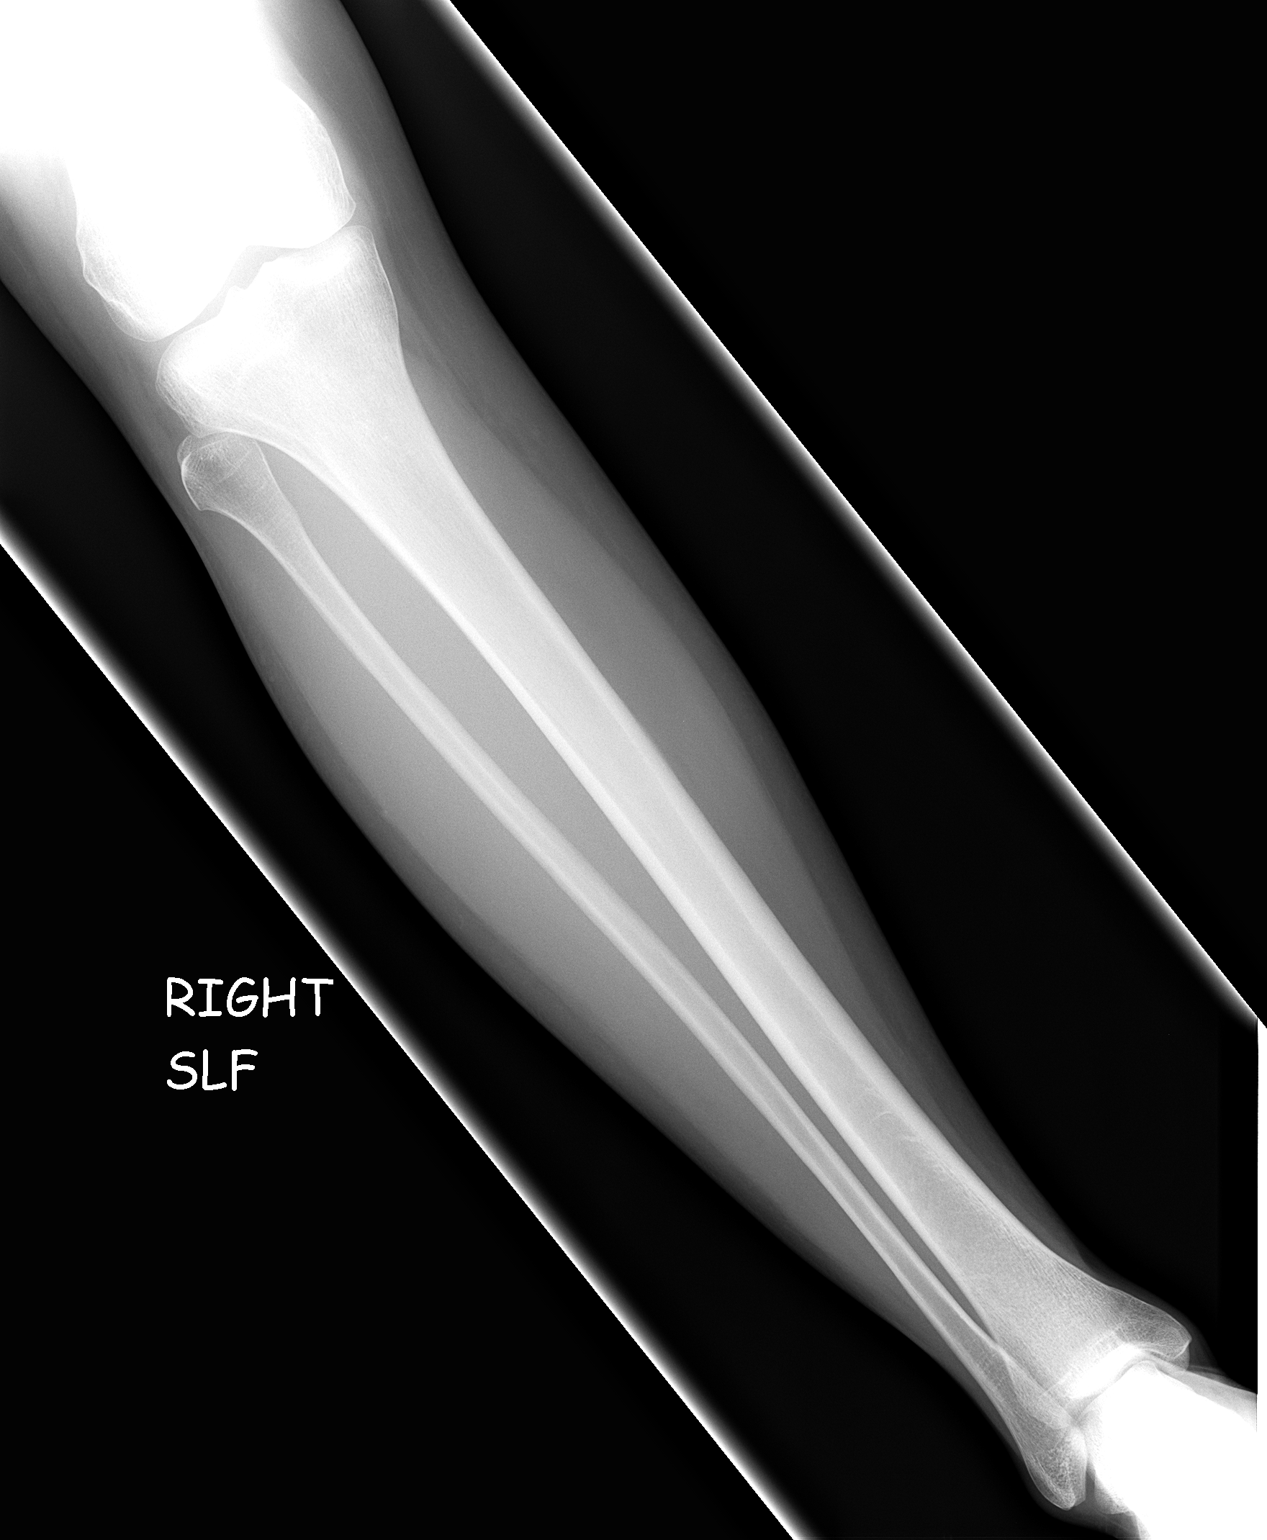

[view not recorded (2 of 2)]
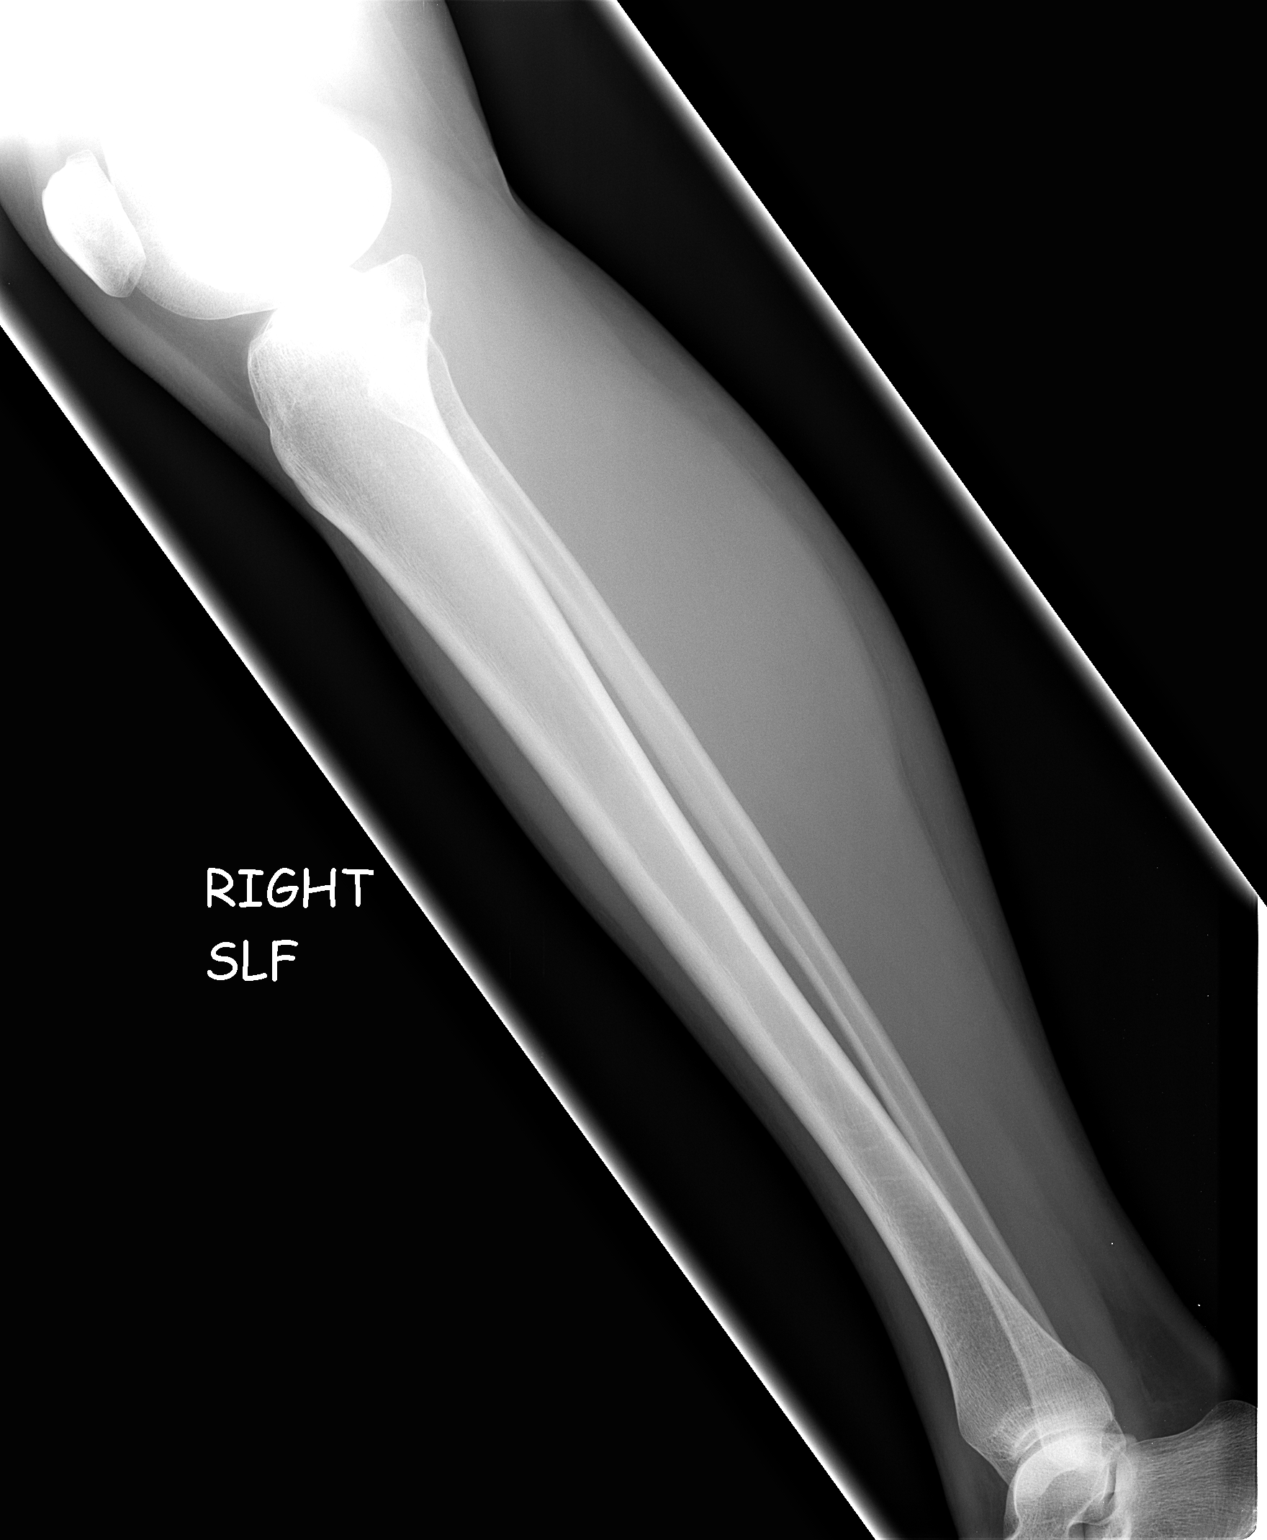

[2 of 2 positions shown; findings below may reference images not displayed]

FINDINGS: No bony abnormality is seen.  Alignment is normal.  The
knee joint and ankle joint appear grossly normal.
IMPRESSION: No bony abnormality.

## 2010-10-20 ENCOUNTER — Encounter: Payer: Self-pay | Admitting: Internal Medicine

## 2010-10-21 ENCOUNTER — Encounter: Payer: Self-pay | Admitting: Endocrinology

## 2010-10-21 ENCOUNTER — Encounter: Payer: Self-pay | Admitting: Internal Medicine

## 2010-10-21 ENCOUNTER — Encounter: Payer: Self-pay | Admitting: *Deleted

## 2010-10-21 ENCOUNTER — Encounter: Payer: Self-pay | Admitting: Family Medicine

## 2011-01-01 LAB — DIFFERENTIAL
Basophils Absolute: 0.1 10*3/uL (ref 0.0–0.1)
Basophils Relative: 1 % (ref 0–1)
Eosinophils Absolute: 0.2 10*3/uL (ref 0.0–0.7)
Eosinophils Relative: 3 % (ref 0–5)
Lymphocytes Relative: 23 % (ref 12–46)
Lymphs Abs: 1.6 10*3/uL (ref 0.7–4.0)
Monocytes Absolute: 0.3 10*3/uL (ref 0.1–1.0)
Monocytes Relative: 4 % (ref 3–12)
Neutro Abs: 4.9 10*3/uL (ref 1.7–7.7)
Neutrophils Relative %: 69 % (ref 43–77)

## 2011-01-01 LAB — CBC
HCT: 38.3 % (ref 36.0–46.0)
Hemoglobin: 13.4 g/dL (ref 12.0–15.0)
MCHC: 35 g/dL (ref 30.0–36.0)
MCV: 90.6 fL (ref 78.0–100.0)
Platelets: 166 10*3/uL (ref 150–400)
RBC: 4.23 MIL/uL (ref 3.87–5.11)
RDW: 12.9 % (ref 11.5–15.5)
WBC: 7.1 10*3/uL (ref 4.0–10.5)

## 2011-01-01 LAB — TSH: TSH: 7.874 u[IU]/mL — ABNORMAL HIGH (ref 0.350–4.500)

## 2011-02-12 NOTE — Op Note (Signed)
NAME:  Judith Castaneda, Judith Castaneda                 ACCOUNT NO.:  [id number]   MEDICAL RECORD NO.:  [id number]          PATIENT TYPE:  AMB   LOCATION:  DAY                          FACILITY:  [facility]   PHYSICIAN:  Currie Paris, M.D.DATE OF BIRTH:  1981/03/11   DATE OF PROCEDURE:  05/29/2007  DATE OF DISCHARGE:                               OPERATIVE REPORT   CLINICAL HISTORY:  Judith Castaneda is a 30 year old lady who I saw back in  February when we discussed doing a cholecystectomy on her and had  initially tentatively scheduled it.  However, she postponed it until  more recently and then added it on.  I have not actually seen her since  February.  However, she has had multiple episodes in the interval of  abdominal pain typical for biliary colic.  She apparently had had an  ultrasound done back when I saw her that showed a thickened gallbladder  wall but no stones.  Reportedly, no stones seen on ultrasound prior to  that.  She actually had been into the emergency room and then seen early  in August for another episode of this pain.   DESCRIPTION OF PROCEDURE:  The patient was seen in the short stay unit.  I reviewed the interval history and did another physical.  In addition  to her current symptoms, she had had a small inframammary fold abscess  that had been draining and was almost healed.  She has had a history of  recurring infections.   The patient was taken to the operating room.  After satisfactory general  endotracheal anesthesia had been obtained, the abdomen was prepped and  draped.  A time out was done.  I used 0.25% plain Marcaine for each  incision.  The umbilical incision was made, the fascia opened, and the  peritoneal cavity entered under direct vision.  A pursestring was  placed, the [place] introduced, and the abdomen insufflated 15.  The  patient was placed in reversed Trendelenburg and tilted to the left.  A  10/11 trocar was placed in the epigastrium and two 5 trocars  laterally.   The gallbladder was noted to be somewhat sclerotic appearing.  There was  an obvious stone in the very top of the gallbladder and another one that  appeared to be wedged at the neck of the gallbladder.  However, she had  what looked like only chronic inflammation.  I was able to easily see  the cystic duct as the patient is very thin.  With some retraction on  the gallbladder, I opened the peritoneum over the cystic duct common  duct area and dissected out the cystic duct.  Initially, I could not  really see the cystic artery, although I had a nice window behind the  cystic duct.  I could see a vessel coursing up along the side of the  gallbladder but this looked too large to be a cystic artery.   At this point, I put a clip on the cystic duct at its junction with the  gallbladder and opened it.  A Cook catheter was introduced and  intraoperative cholangiogram done.  Although the common duct was a  little bit larger than average, there was no stones, no filling defects,  and good filling of the hepatic radicals.  The cystic duct catheter was  removed and three clips placed on the stay side of the cystic duct and  it was divided.  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  With downward and lateral traction on the gallbladder, I  was able to put these branches on tension, clip them and divide them  working my way along the bottom third of the gallbladder up towards the  top.  Once I got to that point, then we had almost a mesentery that we  were able to use the cautery to divide, but as noted, there were  multiple small branches of arteries coming off this relatively large  vessel that was partially covered by liver but running right along the  edge of the gallbladder.  Once the gallbladder was disconnected, it  was  placed in a bag, I irrigated to make sure everything was dry.  The  gallbladder was brought out the umbilical port.   We reinsufflated to make sure everything was dry.  There was no evidence  of bile or bleeding.  The lateral ports were removed.  The abdomen was  deflated through the epigastric port.  The pursestring was used to close  the  umbilical port prior to deflating the abdomen.  The skin was closed with  4-0 Monocryl subcuticular plus Dermabond.  The patient tolerated the  procedure well and there were no operative complications.  All counts  were correct.      Currie Paris, M.D.  Electronically Signed     CJS/MEDQ  D:  05/29/2007  T:  05/30/2007  Job:  [id number]   cc:   Georgina Quint. Plotnikov, MD  [address]  [place]  [place] [address]

## 2011-02-12 NOTE — Assessment & Plan Note (Signed)
[facility]                           PRIMARY CARE OFFICE NOTE   RICA, HEATHER                        MRN:          [id number]  DATE:05/21/2007                            DOB:          [birth year]    PROCEDURE:  Abscess incision and drainage.   REASON:  A 3 x 1.5-cm subcutaneous abscess below right breast anterior  chest wall.      Risks including incomplete procedure, bleeding, infection, scar  formation, others, as well the benefits explained to the patient in  detail and she agreed to proceed.   She was placed in the decubitus position where the abscess area was  prepped with Betadine and alcohol and injected with 1.5 mL of 2%  lidocaine with epinephrine.  A straight blade incision along the skin  line was made, 1 cm, with a small amount of pus extracted.  The abscess  cavity was probed with a dull forceps.  About 1 inch of Iodoform gauze  was inserted in the cavity, Telfa pad with antibiotic ointment, paper  tape.  Wound instructions provided.  Tolerated well.  Complications:  None.  Follow up with me in 1-2 weeks.     Georgina Quint. Plotnikov, MD  Electronically Signed    AVP/MedQ  DD: 05/27/2007  DT: 05/28/2007  Job #: [id number]

## 2011-02-15 NOTE — Group Therapy Note (Signed)
NAME:  Judith Castaneda, Judith Castaneda                           ACCOUNT NO.:  [id number]   MEDICAL RECORD NO.:  [id number]                   PATIENT TYPE:  OUT   LOCATION:  [facility]                           FACILITY:  [facility]   PHYSICIAN:  Elsie Lincoln, MD                   DATE OF BIRTH:  May 28, 1981   DATE OF SERVICE:  03/20/2004                                    CLINIC NOTE   REASON FOR VISIT:  The patient is a 30 year old G4 para 4-0-0-4 status post  NSVD on Feb 18, 2004.  The patient had a noncomplicated delivery or  postpartum course.  The patient appeared to the MAU on March 12, 2004  complaining of something coming out of the vagina.  She was examined and  found to have approximately a grade 2 cystocele by the resident.  She has  not had any bleeding, has not had a period since the delivery.  She just  wanted reassurance today to see if anything needs to be done.  She is not  complaining of any leakage of urine.   PAST MEDICAL HISTORY:  Regurgitation and slight anxiety.   PAST SURGICAL HISTORY:  Denies.   GYNECOLOGICAL HISTORY:  No STDs, abnormal Pap smears, fibroids, or cysts.  Also, GYN history as above.   MEDICATIONS:  Prevacid.   ALLERGIES:  None.   REVIEW OF SYMPTOMS:  No chest pain, shortness of breath, abdominal pain,  constipation, or change in urinary habits.   PHYSICAL EXAMINATION:  VITAL SIGNS:  Pulse 54, blood pressure 121/78.  ABDOMEN:  Soft, nontender, nondistended.  PELVIC:  External genitalia:  Tanner V.  Vagina:  Pink, normal rugae, no  blood or discharge.  Upon Valsalva the bladder comes down to approximately a  grade 2 cystocele.  No leakage of urine.  The uterus well supported.  May be  mild rectocele but not impressive.   ASSESSMENT AND PLAN:  A 30 year old female status post four normal  spontaneous vaginal deliveries, the last normal spontaneous vaginal delivery  being 1 month ago.   1. The patient reassured that nothing in wrong.  There will be a lot of  progression of the cystocele and tightening of tissues as her body     recovers from the vaginal delivery.  We will reevaluate in 6 months to     see if the patient is still symptomatic.  2. The patient's husband to get vasectomy; however, has not done so yet.  In     order to protect herself from getting     pregnant will start on OCPs.  She is bottle feeding only.  UCG is     negative and the patient was given a prescription for Ovcon-35.  Elsie Lincoln, MD    KL/MEDQ  D:  03/20/2004  T:  03/20/2004  Job:  [id number]

## 2011-02-15 NOTE — Group Therapy Note (Signed)
NAME:  Judith Castaneda, Judith Castaneda NO.:  [id number]   MEDICAL RECORD NO.:  [id number]          PATIENT TYPE:  WOC   LOCATION:  [facility]                   FACILITY:  [facility]   PHYSICIAN:  Argentina Donovan, MD        DATE OF BIRTH:  [birth year]   DATE OF SERVICE:  02/13/2006                                    CLINIC NOTE   The patient is a 31 year old white female gravida 4, para 4-0-0-4 with first  baby at age 56 who comes in desiring tubal ligation.  We have talked about  alternatives including especially the IUDs.  The patient says she does not  want that; she wants to be sterilized permanently.  I told her that it is  not a permanent procedure.  There are failures, although you have to  consider it permanent and there is possible complications related to the  procedure itself which could result in either hemorrhage, infection, injury  to bowel, urinary tract, or fatal anesthesia complications.  The patient  seems to understand this.  She has had no surgeries in the past.  She has no  medical allergies and she has no significant medical history.  All her  deliveries were vaginal.  The patient is 5 feet 6 inches, weighs 138 pounds.  Blood pressure 105/62.  Her pulse is 69, temperature 97.8.  Well-developed,  well-nourished white female in no acute distress.  HEENT within normal  limits.  Lungs clear to auscultation, percussion.  Heart:  No murmur.  Normal sinus rhythm.  No CVA tenderness.  Abdomen is soft, flat, nontender.  No masses.  No organomegaly.  External genitalia normal.  BUS within normal  limits.  Vagina is clean and well rugated.  Cervix is clean, parous.  The  uterus is anterior and normal size, shape, consistency with normal adnexa.  Extremities normal with no edema.   IMPRESSION:  Normal gynecologic examination.  Pap smear done.  Scheduling  will be done for tubal ligation.           ______________________________  Argentina Donovan, MD     PR/MEDQ  D:  02/13/2006   T:  02/14/2006  Job:  [id number]

## 2011-07-12 LAB — CBC
HCT: 38.8
MCHC: 34.2
MCV: 85.8
Platelets: 222
RDW: 12.4

## 2011-07-12 LAB — COMPREHENSIVE METABOLIC PANEL
Albumin: 4.6
BUN: 8
Calcium: 9.4
Creatinine, Ser: 0.8
Glucose, Bld: 92
Total Protein: 7.8

## 2011-07-12 LAB — URINALYSIS, MICROSCOPIC ONLY
Bilirubin Urine: NEGATIVE
Ketones, ur: NEGATIVE
Nitrite: NEGATIVE
Protein, ur: NEGATIVE
Specific Gravity, Urine: 1.011
Urobilinogen, UA: 0.2

## 2011-07-12 LAB — DIFFERENTIAL
Lymphocytes Relative: 27
Monocytes Absolute: 0.4
Monocytes Relative: 5
Neutro Abs: 4.9
Neutrophils Relative %: 66

## 2011-07-12 LAB — PREGNANCY, URINE: Preg Test, Ur: NEGATIVE

## 2011-07-15 LAB — POCT URINALYSIS DIP (DEVICE)
Bilirubin Urine: NEGATIVE
Ketones, ur: NEGATIVE
Nitrite: NEGATIVE
Operator id: 235561
Protein, ur: NEGATIVE
pH: 5.5

## 2011-07-15 LAB — DIFFERENTIAL
Basophils Absolute: 0
Basophils Relative: 0
Eosinophils Absolute: 0.2
Eosinophils Relative: 1
Monocytes Absolute: 0.5
Monocytes Relative: 4

## 2011-07-15 LAB — COMPREHENSIVE METABOLIC PANEL
ALT: 24
AST: 20
Albumin: 4.2
Alkaline Phosphatase: 54
BUN: 13
Chloride: 107
Potassium: 4.4
Sodium: 140
Total Bilirubin: 0.4
Total Protein: 7

## 2011-07-15 LAB — CBC
HCT: 38.3
Platelets: 198
RDW: 13.4
WBC: 12.1 — ABNORMAL HIGH

## 2011-07-15 LAB — POCT PREGNANCY, URINE
Operator id: 235561
Preg Test, Ur: NEGATIVE

## 2012-12-30 ENCOUNTER — Other Ambulatory Visit: Payer: Self-pay | Admitting: Family Medicine

## 2012-12-30 DIAGNOSIS — M545 Low back pain: Secondary | ICD-10-CM

## 2017-12-09 DIAGNOSIS — Z3040 Encounter for surveillance of contraceptives, unspecified: Secondary | ICD-10-CM | POA: Diagnosis not present

## 2017-12-09 DIAGNOSIS — L02415 Cutaneous abscess of right lower limb: Secondary | ICD-10-CM | POA: Diagnosis not present

## 2017-12-10 DIAGNOSIS — L02415 Cutaneous abscess of right lower limb: Secondary | ICD-10-CM | POA: Diagnosis not present

## 2018-03-09 DIAGNOSIS — Z3040 Encounter for surveillance of contraceptives, unspecified: Secondary | ICD-10-CM | POA: Diagnosis not present

## 2018-06-08 DIAGNOSIS — Z3040 Encounter for surveillance of contraceptives, unspecified: Secondary | ICD-10-CM | POA: Diagnosis not present

## 2018-06-17 DIAGNOSIS — L0390 Cellulitis, unspecified: Secondary | ICD-10-CM | POA: Diagnosis not present

## 2018-06-17 DIAGNOSIS — L02213 Cutaneous abscess of chest wall: Secondary | ICD-10-CM | POA: Diagnosis not present

## 2018-08-05 DIAGNOSIS — Z01419 Encounter for gynecological examination (general) (routine) without abnormal findings: Secondary | ICD-10-CM | POA: Diagnosis not present

## 2018-08-05 DIAGNOSIS — Z23 Encounter for immunization: Secondary | ICD-10-CM | POA: Diagnosis not present

## 2018-08-05 DIAGNOSIS — Z6827 Body mass index (BMI) 27.0-27.9, adult: Secondary | ICD-10-CM | POA: Diagnosis not present

## 2018-08-06 DIAGNOSIS — Z01419 Encounter for gynecological examination (general) (routine) without abnormal findings: Secondary | ICD-10-CM | POA: Diagnosis not present

## 2018-09-02 DIAGNOSIS — Z3040 Encounter for surveillance of contraceptives, unspecified: Secondary | ICD-10-CM | POA: Diagnosis not present

## 2018-12-01 DIAGNOSIS — Z3040 Encounter for surveillance of contraceptives, unspecified: Secondary | ICD-10-CM | POA: Diagnosis not present

## 2019-01-01 DIAGNOSIS — J019 Acute sinusitis, unspecified: Secondary | ICD-10-CM | POA: Diagnosis not present

## 2019-01-05 DIAGNOSIS — J0110 Acute frontal sinusitis, unspecified: Secondary | ICD-10-CM | POA: Diagnosis not present

## 2019-01-23 DIAGNOSIS — J0100 Acute maxillary sinusitis, unspecified: Secondary | ICD-10-CM | POA: Diagnosis not present

## 2022-06-30 ENCOUNTER — Emergency Department (HOSPITAL_COMMUNITY)
Admission: EM | Admit: 2022-06-30 | Discharge: 2022-07-01 | Disposition: A | Discharge status: Home / Self Care | Payer: 59 | Attending: Emergency Medicine | Admitting: Emergency Medicine

## 2022-06-30 ENCOUNTER — Emergency Department (HOSPITAL_COMMUNITY): Payer: 59

## 2022-06-30 DIAGNOSIS — R1012 Left upper quadrant pain: Secondary | ICD-10-CM | POA: Diagnosis not present

## 2022-06-30 DIAGNOSIS — R0602 Shortness of breath: Secondary | ICD-10-CM | POA: Insufficient documentation

## 2022-06-30 DIAGNOSIS — D72829 Elevated white blood cell count, unspecified: Secondary | ICD-10-CM | POA: Diagnosis not present

## 2022-06-30 DIAGNOSIS — R1011 Right upper quadrant pain: Secondary | ICD-10-CM | POA: Insufficient documentation

## 2022-06-30 DIAGNOSIS — R1013 Epigastric pain: Secondary | ICD-10-CM | POA: Diagnosis not present

## 2022-06-30 DIAGNOSIS — R739 Hyperglycemia, unspecified: Secondary | ICD-10-CM | POA: Insufficient documentation

## 2022-06-30 DIAGNOSIS — R197 Diarrhea, unspecified: Secondary | ICD-10-CM | POA: Diagnosis not present

## 2022-06-30 DIAGNOSIS — R1010 Upper abdominal pain, unspecified: Secondary | ICD-10-CM

## 2022-06-30 DIAGNOSIS — R519 Headache, unspecified: Secondary | ICD-10-CM | POA: Diagnosis not present

## 2022-06-30 LAB — COMPREHENSIVE METABOLIC PANEL
ALT: 17 U/L (ref 0–44)
AST: 15 U/L (ref 15–41)
Albumin: 4.7 g/dL (ref 3.5–5.0)
Alkaline Phosphatase: 69 U/L (ref 38–126)
Anion gap: 7 (ref 5–15)
BUN: 12 mg/dL (ref 6–20)
CO2: 26 mmol/L (ref 22–32)
Calcium: 9.3 mg/dL (ref 8.9–10.3)
Chloride: 109 mmol/L (ref 98–111)
Creatinine, Ser: 0.75 mg/dL (ref 0.44–1.00)
GFR, Estimated: >60 mL/min (ref >60)
Glucose, Bld: 123 mg/dL — ABNORMAL HIGH (ref 70–99)
Potassium: 3.7 mmol/L (ref 3.5–5.1)
Sodium: 142 mmol/L (ref 135–145)
Total Bilirubin: 0.7 mg/dL (ref 0.3–1.2)
Total Protein: 7.7 g/dL (ref 6.5–8.1)

## 2022-06-30 LAB — CBC
HCT: 40.9 % (ref 36.0–46.0)
Hemoglobin: 13.3 g/dL (ref 12.0–15.0)
MCH: 29.8 pg (ref 26.0–34.0)
MCHC: 32.5 g/dL (ref 30.0–36.0)
MCV: 91.5 fL (ref 80.0–100.0)
Platelets: 263 10*3/uL (ref 150–400)
RBC: 4.47 MIL/uL (ref 3.87–5.11)
RDW: 13.2 % (ref 11.5–15.5)
WBC: 12.3 10*3/uL — ABNORMAL HIGH (ref 4.0–10.5)
nRBC: 0 % (ref 0.0–0.2)

## 2022-06-30 LAB — URINALYSIS, ROUTINE W REFLEX MICROSCOPIC
Bacteria, UA: NONE SEEN
Bilirubin Urine: NEGATIVE
Glucose, UA: NEGATIVE mg/dL
Ketones, ur: NEGATIVE mg/dL
Leukocytes,Ua: NEGATIVE
Nitrite: NEGATIVE
Protein, ur: NEGATIVE mg/dL
Specific Gravity, Urine: 1.016 (ref 1.005–1.030)
pH: 5 (ref 5.0–8.0)

## 2022-06-30 LAB — I-STAT BETA HCG BLOOD, ED (MC, WL, AP ONLY): I-stat hCG, quantitative: <5 m[IU]/mL (ref <5)

## 2022-06-30 LAB — LIPASE, BLOOD: Lipase: 30 U/L (ref 11–51)

## 2022-06-30 MED ORDER — SODIUM CHLORIDE 0.9 % IV BOLUS
1000.0000 mL | Freq: Once | INTRAVENOUS | Status: AC
  Administered 2022-06-30: 1000 mL via INTRAVENOUS

## 2022-06-30 MED ORDER — FAMOTIDINE IN NACL 20-0.9 MG/50ML-% IV SOLN
20.0000 mg | Freq: Once | INTRAVENOUS | Status: AC
  Administered 2022-06-30: 20 mg via INTRAVENOUS
  Filled 2022-06-30: qty 50

## 2022-06-30 MED ORDER — ALUM & MAG HYDROXIDE-SIMETH 200-200-20 MG/5ML PO SUSP
30.0000 mL | Freq: Once | ORAL | Status: AC
  Administered 2022-06-30: 30 mL via ORAL
  Filled 2022-06-30: qty 30

## 2022-06-30 NOTE — ED Provider Notes (Signed)
[facility]-EMERGENCY DEPT Provider Note   CSN: [id number] Arrival date & time: 06/30/22  1703     History  Chief Complaint  Patient presents with   Abdominal Pain    Judith Castaneda is a 41 y.o. female with a past medical history of GERD who presents to the emergency department with concerns for upper abdominal pain onset 1 week.  Notes that her upper abdominal pain radiates to her back and up to her head and this has been ongoing for 1 week.  Recently had a steroid injection on 3 days ago to her back.  No meds tried prior to arrival.  Notes that she has a burning sensation to her bilateral lower extremities as well as bilateral upper extremities.  Is concerned that there is ***.  Has associated watery nonbloody diarrhea (3 episodes today) and shortness of breath.  Denies past medical history of CHF.  Denies urinary symptoms, chest pain, nausea, vomiting.    The history is provided by the patient. No language interpreter was used.  Abdominal Pain Associated symptoms: diarrhea and shortness of breath   Associated symptoms: no chest pain, no dysuria, no hematuria, no nausea and no vomiting        Home Medications Prior to Admission medications   Not on File      Allergies    Patient has no known allergies.    Review of Systems   Review of Systems  Respiratory:  Positive for shortness of breath.   Cardiovascular:  Negative for chest pain.  Gastrointestinal:  Positive for abdominal pain and diarrhea. Negative for nausea and vomiting.  Genitourinary:  Negative for dysuria and hematuria.  All other systems reviewed and are negative.   Physical Exam Updated Vital Signs BP (!) 145/81   Pulse 74   Temp 98.7 F (37.1 C) (Oral)   Resp 18   SpO2 100%  Physical Exam Vitals and nursing note reviewed.  Constitutional:      General: She is not in acute distress.    Appearance: She is not diaphoretic.  HENT:     Head: Normocephalic and atraumatic.      Mouth/Throat:     Pharynx: No oropharyngeal exudate.  Eyes:     General: No scleral icterus.    Conjunctiva/sclera: Conjunctivae normal.  Cardiovascular:     Rate and Rhythm: Normal rate and regular rhythm.     Pulses: Normal pulses.     Heart sounds: Normal heart sounds.  Pulmonary:     Effort: Pulmonary effort is normal. No respiratory distress.     Breath sounds: Normal breath sounds. No wheezing.  Abdominal:     General: Bowel sounds are normal.     Palpations: Abdomen is soft. There is no mass.     Tenderness: There is abdominal tenderness in the right upper quadrant, epigastric area and left upper quadrant. There is no guarding or rebound.     Comments: Mild tenderness to palpation noted to upper abdominal region, more so to the left upper quadrant.  Musculoskeletal:        General: Normal range of motion.     Cervical back: Normal range of motion and neck supple.  Skin:    General: Skin is warm and dry.  Neurological:     General: No focal deficit present.     Mental Status: She is alert.     Cranial Nerves: Cranial nerves 2-12 are intact.     Sensory: Sensation is intact. No sensory deficit.  Motor: Motor function is intact. No pronator drift.     Coordination: Coordination is intact.     Comments: No focal neurological deficits noted on exam.  Cranial nerves II through XII intact.  Patient able to ambulate without assistance or difficulty.  Strength sensation intact to bilateral upper and lower extremities.  Grip strength 5/5 bilaterally.  Negative pronator drift.  Psychiatric:        Behavior: Behavior normal.     ED Results / Procedures / Treatments   Labs (all labs ordered are listed, but only abnormal results are displayed) Labs Reviewed  COMPREHENSIVE METABOLIC PANEL - Abnormal; Notable for the following components:      Result Value   Glucose, Bld 123 (*)    All other components within normal limits  CBC - Abnormal; Notable for the following components:    WBC 12.3 (*)    All other components within normal limits  URINALYSIS, ROUTINE W REFLEX MICROSCOPIC - Abnormal; Notable for the following components:   Hgb urine dipstick SMALL (*)    All other components within normal limits  LIPASE, BLOOD  I-STAT BETA HCG BLOOD, ED ([facility], [facility], [facility] ONLY)    EKG None  Radiology No results found.  Procedures Procedures    Medications Ordered in ED Medications - No data to display  ED Course/ Medical Decision Making/ A&P                           Medical Decision Making  Pt presents with *** onset ***. {History of it?}. {Brief denies statement, denies sick contacts, similar symptoms}. Vital signs, pt afebrile. On exam, pt with No focal neurological deficits noted on exam.  Cranial nerves II through XII intact.  Patient able to ambulate without assistance or difficulty.  Strength sensation intact to bilateral upper and lower extremities.  Grip strength 5/5 bilaterally.  Negative pronator drift. Mild tenderness to palpation noted to upper abdominal region, more so to the left upper quadrant. No acute cardiovascular, respiratory, abdominal exam findings. Differential diagnosis includes ***.    Co morbidities that complicate the patient evaluation: ***  Additional history obtained:  Additional history obtained from {sabhistory:27144} External records from outside source obtained and reviewed including: ***  Labs:  I ordered, and personally interpreted labs.  The pertinent results include:   {sablabs:28098}  Imaging: I ordered imaging studies including {sabimaging:28099} I independently visualized and interpreted imaging which showed: *** I agree with the radiologist interpretation  Medications:  I ordered medication including {sabmeds:28100} for *** Reevaluation of the patient after these medicines and interventions, I reevaluated the patient and found that they have {resolved/improved/worsened:23923::"improved"} I have reviewed the patients  home medicines and have made adjustments as needed    {Consultations: I requested consultation with the {sabspecialists:27145}, and discussed lab and imaging findings as well as pertinent plan - they recommend: ***}  Social Determinants of Health: ***   Disposition: {End of MDM here with the likely diagnosis}. After consideration of the diagnostic results and the patients response to treatment, I feel that the patient would benefit from {sabdispo:27146}. Supportive care measures and strict return precautions discussed with patient at bedside. Pt acknowledges and verbalizes understanding. Pt appears safe for discharge. Follow up as indicated in discharge paperwork.    This chart was dictated using voice recognition software, Dragon. Despite the best efforts of this provider to proofread and correct errors, errors may still occur which can change documentation meaning.   {Document critical  care time when appropriate:1} {Document review of labs and clinical decision tools ie heart score, Chads2Vasc2 etc:1}  {Document your independent review of radiology images, and any outside records:1} {Document your discussion with family members, caretakers, and with consultants:1} {Document social determinants of health affecting pt's care:1} {Document your decision making why or why not admission, treatments were needed:1} Final Clinical Impression(s) / ED Diagnoses Final diagnoses:  None    Rx / DC Orders ED Discharge Orders     None

## 2022-06-30 NOTE — ED Provider Triage Note (Signed)
Emergency Medicine Provider Triage Evaluation Note  Judith Castaneda , a 41 y.o. female  was evaluated in triage.  Pt complains of burning in abdomen and back of head x 1 week. Reports similar burning in upper abdomen previously, that goes away on its own. Symptoms have lingered this time. Recently got steroid injections in upper spine and thought the burning in the back of her head could be related to that. Associated loss of appetite and diarrhea. Hx of cholecystectomy  Review of Systems  Positive: As above Negative: Fever, chills, nausea, vomiting, dysuria  Physical Exam  BP 134/81 (BP Location: Left Arm)   Pulse 69   Temp 98.8 F (37.1 C) (Oral)   Resp 16   SpO2 100%  Gen:   Awake, no distress   Resp:  Normal effort  MSK:   Moves extremities without difficulty  Other:    Medical Decision Making  Medically screening exam initiated at 5:24 PM.  Appropriate orders placed.  Judith Castaneda was informed that the remainder of the evaluation will be completed by another provider, this initial triage assessment does not replace that evaluation, and the importance of remaining in the ED until their evaluation is complete.  Workup initiated   Julis Haubner T, PA-C 06/30/22 1725

## 2022-06-30 NOTE — ED Triage Notes (Signed)
Pt  here from home with c/o burning in her abd and back of her head , LOA for about 1 week

## 2022-07-01 ENCOUNTER — Emergency Department (HOSPITAL_COMMUNITY): Payer: 59

## 2022-07-01 ENCOUNTER — Encounter (HOSPITAL_COMMUNITY): Payer: Self-pay

## 2022-07-01 MED ORDER — IOHEXOL 300 MG/ML  SOLN
100.0000 mL | Freq: Once | INTRAMUSCULAR | Status: AC | PRN
  Administered 2022-07-01: 100 mL via INTRAVENOUS

## 2022-07-01 NOTE — Discharge Instructions (Addendum)
It was a pleasure taking care of you today!  Your labs were unremarkable today.  Your CT scans did not show any concerning findings today.  Continue taking your medications as prescribed to you.  Ensure to maintain fluid intake.  Call your primary care provider in the morning to set up a follow-up appointment regarding today's ED visit.  Return to the emergency department if you are experiencing increasing/worsening symptoms.

## 2022-08-09 DIAGNOSIS — M4802 Spinal stenosis, cervical region: Secondary | ICD-10-CM | POA: Insufficient documentation

## 2023-01-03 ENCOUNTER — Other Ambulatory Visit: Payer: Self-pay | Admitting: Internal Medicine

## 2023-01-03 DIAGNOSIS — M4802 Spinal stenosis, cervical region: Secondary | ICD-10-CM

## 2023-01-03 DIAGNOSIS — M4722 Other spondylosis with radiculopathy, cervical region: Secondary | ICD-10-CM

## 2023-01-03 DIAGNOSIS — M4322 Fusion of spine, cervical region: Secondary | ICD-10-CM

## 2023-01-10 ENCOUNTER — Encounter: Payer: Self-pay | Admitting: Internal Medicine

## 2023-01-13 ENCOUNTER — Other Ambulatory Visit: Payer: 59

## 2023-01-15 ENCOUNTER — Encounter: Payer: Self-pay | Admitting: Neurology

## 2023-01-22 ENCOUNTER — Ambulatory Visit
Admission: RE | Admit: 2023-01-22 | Discharge: 2023-01-22 | Disposition: A | Discharge status: Home / Self Care | Payer: 59 | Source: Ambulatory Visit | Attending: Internal Medicine | Admitting: Internal Medicine

## 2023-01-22 DIAGNOSIS — M4322 Fusion of spine, cervical region: Secondary | ICD-10-CM

## 2023-01-22 DIAGNOSIS — M4722 Other spondylosis with radiculopathy, cervical region: Secondary | ICD-10-CM

## 2023-01-22 DIAGNOSIS — M4802 Spinal stenosis, cervical region: Secondary | ICD-10-CM

## 2023-01-22 MED ORDER — GADOPICLENOL 0.5 MMOL/ML IV SOLN
10.0000 mL | Freq: Once | INTRAVENOUS | Status: AC | PRN
  Administered 2023-01-22: 10 mL via INTRAVENOUS

## 2023-01-28 ENCOUNTER — Ambulatory Visit: Payer: 59 | Admitting: Neurology

## 2023-02-05 ENCOUNTER — Ambulatory Visit (INDEPENDENT_AMBULATORY_CARE_PROVIDER_SITE_OTHER): Payer: 59 | Admitting: Neurology

## 2023-02-05 ENCOUNTER — Encounter: Payer: Self-pay | Admitting: Neurology

## 2023-02-05 VITALS — BP 140/74 | HR 94 | Ht 66.0 in | Wt 204.0 lb

## 2023-02-05 DIAGNOSIS — R259 Unspecified abnormal involuntary movements: Secondary | ICD-10-CM | POA: Diagnosis not present

## 2023-02-05 DIAGNOSIS — R202 Paresthesia of skin: Secondary | ICD-10-CM | POA: Diagnosis not present

## 2023-02-05 NOTE — Patient Instructions (Addendum)
Nerve testing of the arms  Routine EEG  ELECTROMYOGRAM AND NERVE CONDUCTION STUDIES (EMG/NCS) INSTRUCTIONS  How to Prepare The neurologist conducting the EMG will need to know if you have certain medical conditions. Tell the neurologist and other EMG lab personnel if you: Have a pacemaker or any other electrical medical device Take blood-thinning medications Have hemophilia, a blood-clotting disorder that causes prolonged bleeding Bathing Take a shower or bath shortly before your exam in order to remove oils from your skin. Don't apply lotions or creams before the exam.  What to Expect You'll likely be asked to change into a hospital gown for the procedure and lie down on an examination table. The following explanations can help you understand what will happen during the exam.  Electrodes. The neurologist or a technician places surface electrodes at various locations on your skin depending on where you're experiencing symptoms. Or the neurologist may insert needle electrodes at different sites depending on your symptoms.  Sensations. The electrodes will at times transmit a tiny electrical current that you may feel as a twinge or spasm. The needle electrode may cause discomfort or pain that usually ends shortly after the needle is removed. If you are concerned about discomfort or pain, you may want to talk to the neurologist about taking a short break during the exam.  Instructions. During the needle EMG, the neurologist will assess whether there is any spontaneous electrical activity when the muscle is at rest - activity that isn't present in healthy muscle tissue - and the degree of activity when you slightly contract the muscle.  He or she will give you instructions on resting and contracting a muscle at appropriate times. Depending on what muscles and nerves the neurologist is examining, he or she may ask you to change positions during the exam.  After your EMG You may experience some  temporary, minor bruising where the needle electrode was inserted into your muscle. This bruising should fade within several days. If it persists, contact your primary care doctor.

## 2023-02-05 NOTE — Progress Notes (Signed)
[facility] Clinic Note - Initial Visit   Date: 02/05/2023   Judith Castaneda MRN: [id number] DOB: April 22, 1981   Dear Dr. Leonor Liv:  Thank you for your kind referral of Judith Castaneda for consultation of burning pain. Although her history is well known to you, please allow [place] to reiterate it for the purpose of our medical record. The patient was accompanied to the clinic by husband who also provides collateral information.     Judith Castaneda is a 42 y.o. right-handed female with hypothyroidism and GERD presenting for evaluation of burning pain.   IMPRESSION/PLAN: Burning dysesthesias over the neck, head, arms, and abdomen are not consistent with neuropathy or multiple sclerosis.  She reports symptoms resolved after cervical decompression and fusion.  Her most recent imaging of the cervical spine was reviewed and shows decompressed spinal cord. Symptoms do not fit a cutaneous nerve or even a dermatomal distribution.  MRI brian is normal excluding MS.  Fortunately, symptoms are well-controlled on Lyrica 100mg  twice daily.  To be complete, she will return for NCS/EMG of the arms.  Intermittent right hand movements, unclear etiology. Symptoms may be tremor, but I did not observe it in the office today.  She is concerned about seizure disorder and I explained that duration is too long for this.  She would like evaluation with EEG, so this will be ordered.    ------------------------------------------------------------- History of present illness: Starting in September, she began having burning sensation over the upper back, abdomen, neck and head.  This prompted imaging of the cervical spine which showed multilevel cervical spondylosis with spinal stenosis at C5-6 and C6-7.  She underwent cervical C4-5, C6-7 in November 2023.  Following surgery, she was takeb off amitriptyline 75mg  and she did well until March.  In March, she began having burning in the back of the arms,  generalized soreness, and right hand twitching.  Symptoms are intermittent throughout the day.  She is now taking Lyrica 100mg  twice daily which controls symptoms.  She was told her symptoms may be MS.  MRI brain was performed at [facility] and normal.    Out-side paper records, electronic medical record, and images have been reviewed where available and summarized as:  MRI cervical spine 01/22/2023: 1. Prior ACDF at C4 through C7 without residual spinal stenosis. Left greater than right uncovertebral spurring at C5-6 and C6-7 with residual moderate left C6 and C7 foraminal stenosis. 2. Mild left eccentric disc bulge with uncovertebral spurring at C2-3 without significant stenosis. 3. Mild bilateral facet hypertrophy at C7-T1 without stenosis.  MRI cervical spine 07/10/2022: 1. C4-5: Left-sided predominant spondylosis with foraminal narrowing left more than right. The left C5 nerve could possibly be affected.  2. C5-6: Spondylosis with spinal stenosis. AP diameter of the canal 6.3 mm. Effacement of the subarachnoid space with slight indentation of the cord but no abnormal cord signal. Bilateral foraminal stenosis could affect either C6 nerve.  3. C6-7: Broad-based disc herniation with caudal down turning in the midline. Spinal stenosis with AP diameter of the canal 7 mm. No cord deformity. Bilateral foraminal stenosis could affect either C7 nerve.   MRI thoracic spine 07/06/2022: 1. No significant thoracic spine findings.  2. Multilevel cervical spondylosis, incompletely visualized, with possible mass effect on the cervical cord at C6-7. Further evaluation recommended with dedicated cervical spine MRI.  3. Mildly prominent common bile duct, potentially normal for this patient status post cholecystectomy.  4. These results will be called to the ordering  clinician or representative by the Radiologist Assistant, and communication documented in the PACS or [employer].   History reviewed.  No pertinent past medical history.  History reviewed. No pertinent surgical history.   Medications:  Outpatient Encounter Medications as of 02/05/2023  Medication Sig   cetirizine (ZYRTEC) 10 MG tablet Take 10 mg by mouth daily.   levothyroxine (SYNTHROID) 100 MCG tablet Take 100 mcg by mouth daily.   pantoprazole (PROTONIX) 40 MG tablet Take 40 mg by mouth daily.   pregabalin (LYRICA) 50 MG capsule Take 100 mg by mouth 2 (two) times daily. 100mg  twice a day   triamcinolone cream (KENALOG) 0.1 % Apply 1 Application topically 2 (two) times daily as needed (eczema).   Vitamin D, Ergocalciferol, (DRISDOL) 1.25 MG (50000 UNIT) CAPS capsule Take 50,000 Units by mouth once a week.   No facility-administered encounter medications on file as of 02/05/2023.    Allergies: No Known Allergies  Family History: Family History  Problem Relation Age of Onset   Thyroid disease Mother    Heart Problems Mother    Diabetes Mother    Lung cancer Mother    Hyperlipidemia Mother    Hypertension Mother    Heart disease Mother    Diabetes Father    Hypertension Father    Hyperlipidemia Father    Autoimmune disease Father        Immune system attacking his brain   Diabetes Sister    Heart disease Sister    Liver disease Sister    Seizures Sister    Brain cancer Brother     Social History: Social History   Tobacco Use   Smoking status: Never   Smokeless tobacco: Never  Substance Use Topics   Alcohol use: Not Currently   Drug use: Never   Social History   Social History Narrative   Right Handed    Lives in a one story home.     Vital Signs:  BP (!) 140/74   Pulse 94   Ht 5\' 6"  (1.676 m)   Wt 204 lb (92.5 kg)   SpO2 98%   BMI 32.93 kg/m   Neurological Exam: MENTAL STATUS including orientation to time, place, person, recent and remote memory, attention span and concentration, language, and fund of knowledge is normal.  Speech is not dysarthric.  CRANIAL NERVES: II:  No visual  field defects.     III-IV-VI: Pupils equal round and reactive to light.  Normal conjugate, extra-ocular eye movements in all directions of gaze.  No nystagmus.  No ptosis.   V:  Normal facial sensation.    VII:  Normal facial symmetry and movements.   VIII:  Normal hearing and vestibular function.   IX-X:  Normal palatal movement.   XI:  Normal shoulder shrug and head rotation.   XII:  Normal tongue strength and range of motion, no deviation or fasciculation.  MOTOR:  No atrophy, fasciculations or abnormal movements.  No pronator drift.   Upper Extremity:  Right  Left  Deltoid  5/5   5/5   Biceps  5/5   5/5   Triceps  5/5   5/5   Wrist extensors  5/5   5/5   Wrist flexors  5/5   5/5   Finger extensors  5/5   5/5   Finger flexors  5/5   5/5   Dorsal interossei  5/5   5/5   Abductor pollicis  5/5   5/5   Tone (Ashworth scale)  0  0   Lower Extremity:  Right  Left  Hip flexors  5/5   5/5   Knee flexors  5/5   5/5   Knee extensors  5/5   5/5   Dorsiflexors  5/5   5/5   Plantarflexors  5/5   5/5   Toe extensors  5/5   5/5   Toe flexors  5/5   5/5   Tone (Ashworth scale)  0  0   MSRs:                                           Right        Left brachioradialis 2+  2+  biceps 2+  2+  triceps 2+  2+  patellar 2+  2+  ankle jerk 2+  2+  Hoffman no  no  plantar response down  down   SENSORY:  Normal and symmetric perception of light touch, pinprick, vibration, and temperature.  Romberg's sign absent.   COORDINATION/GAIT: Normal finger-to- nose-finger.  Intact rapid alternating movements bilaterally.  Able to rise from a chair without using arms.  Gait narrow based and stable. Tandem and stressed gait intact.      Thank you for allowing me to participate in patient's care.  If I can answer any additional questions, I would be pleased to do so.    Sincerely,    Joanne Salah K. Allena Katz, DO

## 2023-02-06 ENCOUNTER — Other Ambulatory Visit: Payer: Self-pay | Admitting: Neurosurgery

## 2023-02-06 DIAGNOSIS — M5412 Radiculopathy, cervical region: Secondary | ICD-10-CM

## 2023-02-07 ENCOUNTER — Ambulatory Visit
Admission: RE | Admit: 2023-02-07 | Discharge: 2023-02-07 | Disposition: A | Discharge status: Home / Self Care | Payer: 59 | Source: Ambulatory Visit | Attending: Neurosurgery | Admitting: Neurosurgery

## 2023-02-07 DIAGNOSIS — M5412 Radiculopathy, cervical region: Secondary | ICD-10-CM

## 2023-02-10 ENCOUNTER — Ambulatory Visit (INDEPENDENT_AMBULATORY_CARE_PROVIDER_SITE_OTHER): Payer: 59 | Admitting: Neurology

## 2023-02-10 DIAGNOSIS — R259 Unspecified abnormal involuntary movements: Secondary | ICD-10-CM | POA: Diagnosis not present

## 2023-02-10 NOTE — Progress Notes (Signed)
EEG complete - results pending 

## 2023-02-11 ENCOUNTER — Ambulatory Visit: Payer: 59 | Admitting: Neurology

## 2023-02-13 ENCOUNTER — Encounter: Payer: 59 | Admitting: Neurology

## 2023-02-13 ENCOUNTER — Ambulatory Visit (INDEPENDENT_AMBULATORY_CARE_PROVIDER_SITE_OTHER): Payer: 59 | Admitting: Neurology

## 2023-02-13 DIAGNOSIS — R202 Paresthesia of skin: Secondary | ICD-10-CM | POA: Diagnosis not present

## 2023-02-13 NOTE — Procedures (Signed)
[facility]  [address] [place], [address]  [place], [place] [address] Tel: [phone] Fax: [phone] Test Date:  02/13/2023  Patient: Judith Castaneda DOB: 1981/06/19 Physician: Nita Sickle, DO  Sex: Female Height: 5\' 6"  Ref Phys: Nita Sickle, DO  ID#: [id number]   Technician:    History: This is a 42 year old female with history of cervical decompression referred for evaluation of generalized burning dysesthesias.  NCV & EMG Findings: Extensive electrodiagnostic testing of the right upper extremity and additional studies of the left shows: Bilateral median, ulnar, and mixed palmar sensory responses are within normal limits. Bilateral median and ulnar motor responses are within normal limits. There is no evidence of active or chronic motor axonal loss changes affecting any of the tested muscles.  Motor unit configuration and recruitment pattern is within normal limits.   Impression: This is a normal study of the upper extremities.  In particular, there is no evidence of carpal tunnel syndrome or a cervical radiculopathy.   ___________________________ Nita Sickle, DO    Nerve Conduction Studies   Stim Site NR Peak (ms) Norm Peak (ms) O-P Amp (V) Norm O-P Amp  Left Median Anti Sensory (2nd Digit)  32 C  Wrist    3.3 <3.4 34.3 >20  Right Median Anti Sensory (2nd Digit)  32 C  Wrist    3.3 <3.4 34.9 >20  Left Ulnar Anti Sensory (5th Digit)  32 C  Wrist    2.8 <3.1 46.7 >12  Right Ulnar Anti Sensory (5th Digit)  32 C  Wrist    2.4 <3.1 45.0 >12     Stim Site NR Onset (ms) Norm Onset (ms) O-P Amp (mV) Norm O-P Amp Site1 Site2 Delta-0 (ms) Dist (cm) Vel (m/s) Norm Vel (m/s)  Left Median Motor (Abd Poll Brev)  32 C  Wrist    3.1 <3.9 10.4 >6 Elbow Wrist 4.9 28.0 57 >50  Elbow    8.0  9.3         Right Median Motor (Abd Poll Brev)  32 C  Wrist    3.4 <3.9 13.7 >6 Elbow Wrist 5.0 27.0 54 >50  Elbow    8.4  13.5         Left Ulnar Motor (Abd Dig Minimi)  32  C  Wrist    2.6 <3.1 13.5 >7 B Elbow Wrist 3.5 20.0 57 >50  B Elbow    6.1  12.6  A Elbow B Elbow 1.6 10.0 62 >50  A Elbow    7.7  12.2         Right Ulnar Motor (Abd Dig Minimi)  32 C  Wrist    2.1 <3.1 12.3 >7 B Elbow Wrist 3.4 21.0 62 >50  B Elbow    5.5  11.6  A Elbow B Elbow 1.6 10.0 63 >50  A Elbow    7.1  11.3            Stim Site NR Peak (ms) Norm Peak (ms) P-T Amp (V) Site1 Site2 Delta-P (ms) Norm Delta (ms)  Left Median/Ulnar Palm Comparison (Wrist - 8cm)  32 C  Median Palm    1.8 <2.2 73.8 Median Palm Ulnar Palm 0.3   Ulnar Palm    1.5 <2.2 27.2      Right Median/Ulnar Palm Comparison (Wrist - 8cm)  32 C  Median Palm    2.0 <2.2 62.8 Median Palm Ulnar Palm 0.1   Ulnar Palm    1.9 <2.2 18.5  Electromyography   Side Muscle Ins.Act Fibs Fasc Recrt Amp Dur Poly Activation Comment  Right 1stDorInt Nml Nml Nml Nml Nml Nml Nml Nml N/A  Right PronatorTeres Nml Nml Nml Nml Nml Nml Nml Nml N/A  Right Biceps Nml Nml Nml Nml Nml Nml Nml Nml N/A  Right Triceps Nml Nml Nml Nml Nml Nml Nml Nml N/A  Right Deltoid Nml Nml Nml Nml Nml Nml Nml Nml N/A  Left 1stDorInt Nml Nml Nml Nml Nml Nml Nml Nml N/A  Left PronatorTeres Nml Nml Nml Nml Nml Nml Nml Nml N/A  Left Biceps Nml Nml Nml Nml Nml Nml Nml Nml N/A  Left Triceps Nml Nml Nml Nml Nml Nml Nml Nml N/A  Left Deltoid Nml Nml Nml Nml Nml Nml Nml Nml N/A      Waveforms:

## 2023-02-25 ENCOUNTER — Telehealth: Payer: Self-pay | Admitting: Neurology

## 2023-02-25 NOTE — Telephone Encounter (Signed)
See EEG results

## 2023-02-25 NOTE — Telephone Encounter (Signed)
Pt called in to get her results.  

## 2023-02-25 NOTE — Procedures (Signed)
ELECTROENCEPHALOGRAM REPORT  Date of Study: 02/10/2023  Patient's Name: Uldine D Rosander MRN: [id number] Date of Birth: 1980/11/18  Referring Provider: Dr. Nita Sickle  Clinical History: This is a 42 year old woman with intermittent right hand movements. EEG for classification.  Medications: Lyrica, Synthroid, Zyrted  Technical Summary: A multichannel digital EEG recording measured by the international 10-20 system with electrodes applied with paste and impedances below 5000 ohms performed in our laboratory with EKG monitoring in an awake and asleep patient.  Hyperventilation and photic stimulation were performed.  The digital EEG was referentially recorded, reformatted, and digitally filtered in a variety of bipolar and referential montages for optimal display.    Description: The patient is awake and asleep during the recording.  During maximal wakefulness, there is a symmetric, medium voltage 9 Hz posterior dominant rhythm that attenuates with eye opening.  The record is symmetric.  During drowsiness and sleep, there is an increase in theta slowing of the background.  Vertex waves and symmetric sleep spindles were seen. Hyperventilation and photic stimulation did not elicit any abnormalities.  There were no epileptiform discharges or electrographic seizures seen.    Technical difficulties with EKG lead.  Impression: This awake and asleep EEG is normal.    Clinical Correlation: A normal EEG does not exclude a clinical diagnosis of epilepsy.  If further clinical questions remain, prolonged EEG may be helpful.  Clinical correlation is advised.   Patrcia Dolly, M.D.

## 2023-02-26 NOTE — Telephone Encounter (Signed)
02/10/2023: EEG is normal, no evidence of seizures or abnormal brain electrical activity.   Called patient and informed her of results. Patient verbalized understanding and had no further questions or concerns.

## 2023-03-03 ENCOUNTER — Ambulatory Visit: Payer: 59 | Admitting: Neurology

## 2023-05-27 NOTE — Progress Notes (Unsigned)
Cardiology Office Note:    Date:  05/28/2023   ID:  Judith Castaneda, [place] 01/07/1981, MRN [id number]  PCP:  Marylen Ponto, MD  Cardiologist:  Norman Herrlich, MD   Referring MD: No ref. provider found  ASSESSMENT:    1. Palpitations   2. Chest pain, unspecified type   3. SOB (shortness of breath) on exertion   4. Precordial pain    PLAN:    In order of problems listed above:  She feels that her predominant problem is palpitation she will use a 2-week ZIO monitor I suspect will capture episodes and make the decision whether she has significant arrhythmia even atrial fibrillation strongly suggested with her family history Chest pain her pretest probability of CAD is low but she has symptoms best described as possible angina and will undergo cardiac CTA Shortness of breath may represent cardiomyopathy echocardiogram is ordered  Next appointment 6 weeks office follow-up   Medication Adjustments/Labs and Tests Ordered: Current medicines are reviewed at length with the patient today.  Concerns regarding medicines are outlined above.  Orders Placed This Encounter  Procedures   EKG 12-Lead   No orders of the defined types were placed in this encounter.    Chief Complaint  Patient presents with   Chest Pain   Palpitations   Tachycardia    Ongoing since 2005    History of Present Illness:    Judith Castaneda is a 42 y.o. female who is being seen today for the evaluation of multiple cardiac complaints including palpitation shortness of breath and chest pain at the request of the patient self-referred.  She has had symptoms from 2005 she said she was seen in urgent care in [place] and was found to have palpitation no specific cardiac treatment and no further evaluation Symptoms have waxed and waned since then but recently become much worse the episodes occur several times a week have awaken her from her sleep and they vary between brief fluttering to persist rapid heart rate.  She  has 2 brothers with atrial fibrillation in their 39s and is concerned that is what is wrong.  She takes no over-the-counter proarrhythmic drugs she has no history of underlying heart disease congenital rheumatic or documented arrhythmia Her second complaint is chest pain she gets episodes of pressure in her chest with and without activity but relieved with rest last 5 to 10 minutes and is now occurring several times a week increasing frequency. Overall she does not feel well she is fatigued exercise intolerance and also has shortness of breath exertional. She was told she had a murmur as a child She is frustrated that she has not gotten answers and self-referred herself She has no history of lung disease non-smoker no cough or wheezing she has no edema orthopnea and has not had syncope.   Past Medical History:  Diagnosis Date   Hypothyroidism    Vitamin D deficiency disease     Past Surgical History:  Procedure Laterality Date   CERVICAL FUSION     C4-5-6-7   CHOLECYSTECTOMY      Current Medications: Current Meds  Medication Sig   cetirizine (ZYRTEC) 10 MG tablet Take 10 mg by mouth daily.   levothyroxine (SYNTHROID) 100 MCG tablet Take 100 mcg by mouth daily.   pantoprazole (PROTONIX) 40 MG tablet Take 40 mg by mouth daily.   pregabalin (LYRICA) 100 MG capsule Take 100 mg by mouth 2 (two) times daily. 100mg  twice a day   triamcinolone cream (  KENALOG) 0.1 % Apply 1 Application topically 2 (two) times daily as needed (eczema).   Vitamin D, Ergocalciferol, (DRISDOL) 1.25 MG (50000 UNIT) CAPS capsule Take 50,000 Units by mouth once a week.     Allergies:   Morphine   Social History   Socioeconomic History   Marital status: Married    Spouse name: Not on file   Number of children: Not on file   Years of education: Not on file   Highest education level: Not on file  Occupational History   Not on file  Tobacco Use   Smoking status: Never   Smokeless tobacco: Never  Substance  and Sexual Activity   Alcohol use: Not Currently   Drug use: Never   Sexual activity: Yes  Other Topics Concern   Not on file  Social History Narrative   Right Handed    Lives in a one story home.    Social Determinants of Health   Financial Resource Strain: Not on file  Food Insecurity: Low Risk  (08/29/2022)   [facility]   Food vital sign    Within the past 12 months, you worried that your food would run out before you got money to buy more: Never true    Within the past 12 months, the food you bought just didn't last and you didn't have money to get more: Not on file  Transportation Needs: No Transportation Needs (08/29/2022)   [facility]   Transportation    In the past 12 months, has lack of reliable transportation kept you from medical appointments, meetings, work or from getting things needed for daily living? : No  Physical Activity: Not on file  Stress: Not on file  Social Connections: Not on file     Family History: The patient's family history includes Autoimmune disease in her father; Brain cancer in her brother; Diabetes in her father, mother, and sister; Heart Problems in her mother; Heart disease in her mother and sister; Hyperlipidemia in her father and mother; Hypertension in her father and mother; Liver disease in her sister; Lung cancer in her mother; Seizures in her sister; Thyroid disease in her mother.  ROS:   ROS Please see the history of present illness.     All other systems reviewed and are negative.  EKGs/Labs/Other Studies Reviewed:    The following studies were reviewed today: EKG Interpretation Date/Time:  Wednesday May 28 2023 08:57:29 EDT Ventricular Rate:  56 PR Interval:  144 QRS Duration:  86 QT Interval:  404 QTC Calculation: 389 R Axis:   0  Text Interpretation: Sinus bradycardia otherwise normal EKG When compared with ECG of 29-May-2007 10:24, No significant change was  found Confirmed by Norman Herrlich ([id number]) on 05/28/2023 9:01:58 AM    Physical Exam:    VS:  BP 122/82 (BP Location: Right Arm, Patient Position: Sitting)   Pulse 65   Ht 5\' 7"  (1.702 m)   Wt 206 lb 3.2 oz (93.5 kg)   SpO2 97%   BMI 32.30 kg/m     Wt Readings from Last 3 Encounters:  05/28/23 206 lb 3.2 oz (93.5 kg)  02/05/23 204 lb (92.5 kg)  07/10/07 145 lb (65.8 kg)     GEN:  Well nourished, well developed in no acute distress HEENT: Normal NECK: No JVD; No carotid bruits LYMPHATICS: No lymphadenopathy CARDIAC: RRR, no murmurs, rubs, gallops RESPIRATORY:  Clear to auscultation without rales, wheezing or rhonchi  ABDOMEN: Soft,  non-tender, non-distended MUSCULOSKELETAL:  No edema; No deformity  SKIN: Warm and dry NEUROLOGIC:  Alert and oriented x 3 PSYCHIATRIC:  Normal affect     Signed, Norman Herrlich, MD  05/28/2023 9:17 AM    [facility]

## 2023-05-28 ENCOUNTER — Ambulatory Visit: Payer: 59 | Attending: Cardiology

## 2023-05-28 ENCOUNTER — Encounter: Payer: Self-pay | Admitting: Cardiology

## 2023-05-28 ENCOUNTER — Ambulatory Visit: Payer: 59 | Attending: Cardiology | Admitting: Cardiology

## 2023-05-28 VITALS — BP 122/82 | HR 65 | Ht 67.0 in | Wt 206.2 lb

## 2023-05-28 DIAGNOSIS — R079 Chest pain, unspecified: Secondary | ICD-10-CM

## 2023-05-28 DIAGNOSIS — R002 Palpitations: Secondary | ICD-10-CM

## 2023-05-28 DIAGNOSIS — R0602 Shortness of breath: Secondary | ICD-10-CM

## 2023-05-28 DIAGNOSIS — R072 Precordial pain: Secondary | ICD-10-CM

## 2023-05-28 MED ORDER — METOPROLOL TARTRATE 100 MG PO TABS: 100.0000 mg | ORAL_TABLET | Freq: Once | ORAL | 0 refills | Status: DC

## 2023-05-28 NOTE — Patient Instructions (Signed)
Medication Instructions:  Your physician recommends that you continue on your current medications as directed. Please refer to the Current Medication list given to you today.  *If you need a refill on your cardiac medications before your next appointment, please call your pharmacy*   Lab Work: Your physician recommends that you return for lab work in:   Labs 1 week before CT: BMP  If you have labs (blood work) drawn today and your tests are completely normal, you will receive your results only by: MyChart Message (if you have MyChart) OR A paper copy in the mail If you have any lab test that is abnormal or we need to change your treatment, we will call you to review the results.   Testing/Procedures: A zio monitor was ordered today. It will remain on for 14 days. You will then return monitor and event diary in provided box. It takes 1-2 weeks for report to be downloaded and returned to [place]. We will call you with the results. If monitor falls off or has orange flashing light, please call [employer] for further instructions.   Your physician has requested that you have an echocardiogram. Echocardiography is a painless test that uses sound waves to create images of your heart. It provides your doctor with information about the size and shape of your heart and how well your heart's chambers and valves are working. This procedure takes approximately one hour. There are no restrictions for this procedure. Please do NOT wear cologne, perfume, aftershave, or lotions (deodorant is allowed). Please arrive 15 minutes prior to your appointment time.    Your cardiac CT will be scheduled at one of the below locations:   [facility] [address] [place], [place] [address] [phone]  OR  [facility] [address] [place], [place] [address] [phone]  OR   [facility] [address] [place], [place]  [address] [phone]  If scheduled at [facility], please arrive at the [facility] Entrance (Entrance C2) of [facility] 30 minutes prior to test start time. You can use the FREE valet parking offered at entrance C (encouraged to control the heart rate for the test)  Proceed to the [facility] Radiology Department (first floor) to check-in and test prep.  All radiology patients and guests should use entrance C2 at [facility], accessed from [facility], even though the hospital's physical address listed is [address]    If scheduled at [facility] or [facility], please arrive 15 mins early for check-in and test prep.  There is spacious parking and easy access to the radiology department from the [facility] entrance. Please enter here and check-in with the desk attendant.   Please follow these instructions carefully (unless otherwise directed):  An IV will be required for this test and Nitroglycerin will be given.  Hold all erectile dysfunction medications at least 3 days (72 hrs) prior to test. (Ie viagra, cialis, sildenafil, tadalafil, etc)   On the Night Before the Test: Be sure to Drink plenty of water. Do not consume any caffeinated/decaffeinated beverages or chocolate 12 hours prior to your test. Do not take any antihistamines 12 hours prior to your test.  On the Day of the Test: Drink plenty of water until 1 hour prior to the test. Do not eat any food 1 hour prior to test. You may take your  regular medications prior to the test.  Take metoprolol (Lopressor) two hours prior to test. FEMALES- please wear underwire-free bra if available, avoid dresses & tight clothing      After the Test: Drink plenty of water. After receiving IV contrast, you may experience a mild flushed feeling. This is normal. On occasion, you may experience a mild rash up to 24 hours  after the test. This is not dangerous. If this occurs, you can take Benadryl 25 mg and increase your fluid intake. If you experience trouble breathing, this can be serious. If it is severe call 911 IMMEDIATELY. If it is mild, please call our office.  We will call to schedule your test 2-4 weeks out understanding that some insurance companies will need an authorization prior to the service being performed.   For more information and frequently asked questions, please visit our website : [email or web address]  For non-scheduling related questions, please contact the cardiac imaging nurse navigator should you have any questions/concerns: Cardiac Imaging Nurse Navigators Direct Office Dial: [phone]   For scheduling needs, including cancellations and rescheduling, please call [place], [phone].    Follow-Up: At [facility], you and your health needs are our priority.  As part of our continuing mission to provide you with exceptional heart care, we have created designated Provider Care Teams.  These Care Teams include your primary Cardiologist (physician) and Advanced Practice Providers (APPs -  Physician Assistants and Nurse Practitioners) who all work together to provide you with the care you need, when you need it.  We recommend signing up for the patient portal called "MyChart".  Sign up information is provided on this After Visit Summary.  MyChart is used to connect with patients for Virtual Visits (Telemedicine).  Patients are able to view lab/test results, encounter notes, upcoming appointments, etc.  Non-urgent messages can be sent to your provider as well.   To learn more about what you can do with MyChart, go to [email or web address].    Your next appointment:   6 week(s)  Provider:   Norman Herrlich, MD    Other Instructions None

## 2023-06-07 LAB — BASIC METABOLIC PANEL
BUN/Creatinine Ratio: 14 (ref 9–23)
BUN: 13 mg/dL (ref 6–24)
CO2: 23 mmol/L (ref 20–29)
Calcium: 9.1 mg/dL (ref 8.7–10.2)
Chloride: 105 mmol/L (ref 96–106)
Creatinine, Ser: 0.93 mg/dL (ref 0.57–1.00)
Glucose: 85 mg/dL (ref 70–99)
Potassium: 4.1 mmol/L (ref 3.5–5.2)
Sodium: 143 mmol/L (ref 134–144)
eGFR: 78 mL/min/{1.73_m2} (ref >59)

## 2023-06-10 ENCOUNTER — Telehealth (HOSPITAL_COMMUNITY): Payer: Self-pay | Admitting: *Deleted

## 2023-06-10 NOTE — Telephone Encounter (Signed)
Reaching out to patient to offer assistance regarding upcoming cardiac imaging study; pt verbalizes understanding of appt date/time, parking situation and where to check in, pre-test NPO status and medications ordered, and verified current allergies; name and call back number provided for further questions should they arise  Larey Brick RN Navigator Cardiac Imaging [facility] [phone] office [phone] cell  Patient is aware to take 100mg  metoprolol tartrate if her HR is greater than 70 bpm. She is aware to arrive at 3:30 PM.

## 2023-06-11 ENCOUNTER — Ambulatory Visit (HOSPITAL_COMMUNITY)
Admission: RE | Admit: 2023-06-11 | Discharge: 2023-06-11 | Disposition: A | Discharge status: Home / Self Care | Payer: 59 | Source: Ambulatory Visit | Attending: Cardiology | Admitting: Cardiology

## 2023-06-11 DIAGNOSIS — R072 Precordial pain: Secondary | ICD-10-CM | POA: Diagnosis present

## 2023-06-11 MED ORDER — IOHEXOL 350 MG/ML SOLN
95.0000 mL | Freq: Once | INTRAVENOUS | Status: AC | PRN
  Administered 2023-06-11: 95 mL via INTRAVENOUS

## 2023-06-11 MED ORDER — NITROGLYCERIN 0.4 MG SL SUBL
SUBLINGUAL_TABLET | SUBLINGUAL | Status: AC
  Filled 2023-06-11: qty 2

## 2023-06-11 MED ORDER — NITROGLYCERIN 0.4 MG SL SUBL
0.8000 mg | SUBLINGUAL_TABLET | Freq: Once | SUBLINGUAL | Status: AC
  Administered 2023-06-11: 0.8 mg via SUBLINGUAL

## 2023-06-12 ENCOUNTER — Telehealth: Payer: Self-pay

## 2023-06-12 NOTE — Telephone Encounter (Signed)
-----   Message from Norman Herrlich sent at 06/11/2023  8:18 PM EDT ----- Normal or stable result

## 2023-06-12 NOTE — Telephone Encounter (Signed)
Patient notified of results.

## 2023-06-23 ENCOUNTER — Other Ambulatory Visit: Payer: Self-pay

## 2023-06-23 MED ORDER — METOPROLOL SUCCINATE ER 25 MG PO TB24: 25.0000 mg | ORAL_TABLET | Freq: Every day | ORAL | 3 refills | Status: DC

## 2023-07-02 ENCOUNTER — Ambulatory Visit: Payer: 59 | Attending: Cardiology

## 2023-07-02 DIAGNOSIS — R0602 Shortness of breath: Secondary | ICD-10-CM

## 2023-07-02 DIAGNOSIS — R079 Chest pain, unspecified: Secondary | ICD-10-CM | POA: Diagnosis not present

## 2023-07-02 DIAGNOSIS — R072 Precordial pain: Secondary | ICD-10-CM | POA: Diagnosis not present

## 2023-07-02 DIAGNOSIS — R002 Palpitations: Secondary | ICD-10-CM | POA: Diagnosis not present

## 2023-07-02 LAB — ECHOCARDIOGRAM COMPLETE
Area-P 1/2: 3.88 cm2
S' Lateral: 3.4 cm

## 2023-07-09 NOTE — Progress Notes (Signed)
 " Cardiology Office Note:  .   Date:  07/10/2023  ID:  Judith Castaneda, Judith Castaneda [birth year], MRN [id number] PCP: Ina Marcellus RAMAN, MD  [facility] Providers Cardiologist:  None    History of Present Illness: .   Judith Castaneda is a 42 y.o. female with a past medical history of palpitations, asthma, GERD, hypothyroidism, anxiety, and depression.  Most recently evaluated by Dr. Monetta on 05/28/2023 with concerns of palpitation and chest pain. A coronary CTA was arranged and completed on 06/22/2023 revealing a calcium score of 0.  An echocardiogram was arranged and completed on 07/02/2023 revealing an EF of 60 to 65%, no valvular abnormalities.  A monitor was arranged which revealed an average heart rate of 69 bpm, predominant rhythm was sinus, 3 triggered events associated with SVT, no significant pauses, no episodes of atrial fibrillation or flutter.  She presents today accompanied by her husband for follow-up of the recent testing as outlined above.  She was advised she can take metoprolol  for her SVT however she was concerned because her resting heart rate is low.  She is reassured that her testing has been negative.  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 She denies chest pain, palpitations, dyspnea, pnd, orthopnea, n, v, dizziness, syncope, edema, weight gain, or early satiety.   ROS: Review of Systems  Cardiovascular:  Positive for palpitations.  All other systems reviewed and are negative.    Studies Reviewed: .        Cardiac Studies & Procedures       ECHOCARDIOGRAM  ECHOCARDIOGRAM COMPLETE 07/02/2023  Narrative ECHOCARDIOGRAM REPORT    Patient Name:   Judith Castaneda Date of Exam: 07/02/2023 Medical Rec #:  [id number]     Height:       67.0 in Accession #:    [id number]    Weight:       206.2 lb Date of Birth:  Feb 28, 1981    BSA:          2.049 m Patient Age:    41  years      BP:           122/70 mmHg Patient Gender: F             HR:           73 bpm. Exam Location:  [place]  Procedure: 2D Echo, Cardiac Doppler, Color Doppler and Strain Analysis  Indications:    Chest pain, unspecified type [R07.9 (ICD-10-CM)]; Palpitations [R00.2 (ICD-10-CM)]; SOB (shortness of breath) on exertion [R06.02 (ICD-10-CM)]; Precordial pain [R07.2 (ICD-10-CM)]  History:        Patient has no prior history of Echocardiogram examinations. Signs/Symptoms:Chest Pain and Shortness of Breath. Palpitations [R00.2]. Precordial pain [R07.2].  Sonographer:    Charlie Jointer RDCS Referring Phys: [id number] BRIAN J MUNLEY  IMPRESSIONS   1. Left ventricular ejection fraction, by estimation, is 60 to 65%. The left ventricle has normal function. The left ventricle has no regional wall motion abnormalities. Left ventricular diastolic parameters were normal. The average left ventricular global longitudinal strain is -19.0 %. The global longitudinal strain is normal. 2. Right ventricular systolic function is normal. The right ventricular size is normal. There is normal pulmonary artery systolic pressure. 3. The mitral valve is grossly normal. No evidence of mitral valve regurgitation. No evidence of mitral stenosis. 4. The aortic valve is tricuspid. Aortic  valve regurgitation is not visualized. No aortic stenosis is present. 5. The inferior vena cava is normal in size with greater than 50% respiratory variability, suggesting right atrial pressure of 3 mmHg.  Comparison(s): No prior Echocardiogram.  FINDINGS Left Ventricle: Left ventricular ejection fraction, by estimation, is 60 to 65%. The left ventricle has normal function. The left ventricle has no regional wall motion abnormalities. The average left ventricular global longitudinal strain is -19.0 %. The global longitudinal strain is normal. The left ventricular internal cavity size was normal in size. There is no left ventricular  hypertrophy. Left ventricular diastolic parameters were normal.  Right Ventricle: The right ventricular size is normal. Right vetricular wall thickness was not well visualized. Right ventricular systolic function is normal. There is normal pulmonary artery systolic pressure. The tricuspid regurgitant velocity is 2.51 m/s, and with an assumed right atrial pressure of 3 mmHg, the estimated right ventricular systolic pressure is 28.2 mmHg.  Left Atrium: Left atrial size was normal in size.  Right Atrium: Right atrial size was normal in size.  Pericardium: There is no evidence of pericardial effusion.  Mitral Valve: The mitral valve is grossly normal. No evidence of mitral valve regurgitation. No evidence of mitral valve stenosis.  Tricuspid Valve: The tricuspid valve is not well visualized. Tricuspid valve regurgitation is not demonstrated. No evidence of tricuspid stenosis.  Aortic Valve: The aortic valve is tricuspid. Aortic valve regurgitation is not visualized. No aortic stenosis is present.  Pulmonic Valve: The pulmonic valve was not well visualized. Pulmonic valve regurgitation is not visualized. No evidence of pulmonic stenosis.  Aorta: The aortic root and ascending aorta are structurally normal, with no evidence of dilitation.  Venous: The inferior vena cava is normal in size with greater than 50% respiratory variability, suggesting right atrial pressure of 3 mmHg.  IAS/Shunts: The interatrial septum was not well visualized.   LEFT VENTRICLE PLAX 2D LVIDd:         5.40 cm   Diastology LVIDs:         3.40 cm   LV e' medial:    9.83 cm/s LV PW:         0.80 cm   LV E/e' medial:  9.3 LV IVS:        0.90 cm   LV e' lateral:   17.45 cm/s LVOT diam:     2.10 cm   LV E/e' lateral: 5.2 LV SV:         81 LV SV Index:   39        2D Longitudinal Strain LVOT Area:     3.46 cm  2D Strain GLS Avg:     -19.0 %   RIGHT VENTRICLE             IVC RV Basal diam:  3.80 cm     IVC diam: 2.00  cm RV Mid diam:    3.00 cm RV S prime:     13.90 cm/s TAPSE (M-mode): 2.9 cm  LEFT ATRIUM           Index        RIGHT ATRIUM           Index LA diam:      2.80 cm 1.37 cm/m   RA Area:     14.60 cm LA Vol (A2C): 34.4 ml 16.79 ml/m  RA Volume:   34.40 ml  16.79 ml/m LA Vol (A4C): 41.6 ml 20.31 ml/m AORTIC VALVE LVOT Vmax:   108.00 cm/s  LVOT Vmean:  70.500 cm/s LVOT VTI:    0.234 m  AORTA Ao Root diam: 3.00 cm Ao Asc diam:  3.00 cm Ao Desc diam: 2.00 cm  MITRAL VALVE               TRICUSPID VALVE MV Area (PHT): 3.88 cm    TR Peak grad:   25.2 mmHg MV Decel Time: 196 msec    TR Vmax:        251.00 cm/s MV E velocity: 91.45 cm/s MV A velocity: 67.60 cm/s  SHUNTS MV E/A ratio:  1.35        Systemic VTI:  0.23 m Systemic Diam: 2.10 cm  Sreedhar reddy Madireddy Electronically signed by Alean reddy Madireddy Signature Date/Time: 07/02/2023/4:49:44 PM    Final    MONITORS  LONG TERM MONITOR (3-14 DAYS) 06/18/2023  Narrative Patch Wear Time:  13 days and 21 hours (2024-08-28T09:31:30-0400 to 2024-09-11T07:18:22-0400)  Patient had a min HR of 37 bpm, max HR of 169 bpm, and avg HR of 69 bpm. Predominant underlying rhythm was Sinus Rhythm.  There were 3 triggered events 1 associated with SVT.  There were no sinus pauses of 3 seconds and no episodes of second or third-degree AV nodal block.  There are no episodes of atrial fibrillation or flutter.  3 Supraventricular Tachycardia runs occurred, the run with the fastest interval lasting 11 beats with a max rate of 169 bpm (avg 148 bpm); the run with the fastest interval was also the longest. Supraventricular Tachycardia was detected within +/- 45 seconds of symptomatic patient event(s).  Isolated SVEs were rare (<1.0%), SVE Couplets were rare (<1.0%), and SVE Triplets were rare (<1.0%).  Isolated VEs were rare (<1.0%), VE Couplets were rare (<1.0%), and no VE Triplets were present.   CT SCANS  CT CORONARY MORPH W/CTA  COR W/SCORE 06/11/2023  Addendum 06/22/2023  3:56 PM ADDENDUM REPORT: 06/22/2023 15:53  EXAM: OVER-READ INTERPRETATION  CT CHEST  The following report is an over-read performed by radiologist Dr. Andrea Gasman of [facility] on 06/22/2023. This over-read does not include interpretation of cardiac or coronary anatomy or pathology. The coronary CTA interpretation by the cardiologist is attached.  COMPARISON:  None.  FINDINGS: Vascular: No aortic atherosclerosis. The included aorta is normal in caliber.  Mediastinum/nodes: No adenopathy or mass. Unremarkable esophagus.  Lungs: No focal airspace disease. Benign calcified granuloma in the right middle lobe. No pleural fluid. The included airways are patent.  Upper abdomen: No acute or unexpected findings.  Musculoskeletal: There are no acute or suspicious osseous abnormalities.  IMPRESSION: No acute or unexpected extracardiac findings.   Electronically Signed By: Andrea Gasman M.D. On: 06/22/2023 15:53  Narrative CLINICAL DATA:  42 yo female with chest pain  EXAM: Cardiac/Coronary CTA  TECHNIQUE: A non-contrast, gated CT scan was obtained with axial slices of 3 mm through the heart for calcium scoring. Calcium scoring was performed using the Agatston method. A 120 kV prospective, gated, contrast cardiac scan was obtained. Gantry rotation speed was 250 msecs and collimation was 0.6 mm. Two sublingual nitroglycerin  tablets (0.8 mg) were given. The 3D data set was reconstructed in 5% intervals of the 35-75% of the R-R cycle. Diastolic phases were analyzed on a dedicated workstation using MPR, MIP, and VRT modes. The patient received 95 cc of contrast.  FINDINGS: Image quality: Average.  Noise artifact is: Limited.  Coronary Arteries:  Normal coronary origin.  Right dominance.  Left main: The left main is a large  caliber vessel with a normal take off from the left coronary cusp that bifurcates to  form a left anterior descending artery and a left circumflex artery. There is no plaque or stenosis.  Left anterior descending artery: The LAD is patent without evidence of plaque or stenosis. The LAD gives off 3 patent diagonal branches (D2 and D3 are small).  Left circumflex artery: The LCX is non-dominant and patent with no evidence of plaque or stenosis. The LCX gives off 2 patent obtuse marginal branches.  Right coronary artery: The RCA is dominant with normal take off from the right coronary cusp. There is no evidence of plaque or stenosis. The RCA terminates as a PDA and right posterolateral branch without evidence of plaque or stenosis.  Right Atrium: Right atrial size is within normal limits.  Right Ventricle: The right ventricular cavity is within normal limits.  Left Atrium: Left atrial size is normal in size with no left atrial appendage filling defect.  Left Ventricle: The ventricular cavity size is within normal limits.  Pulmonary arteries: Normal in size.  Pulmonary veins: Normal pulmonary venous drainage.  Pericardium: Normal thickness without significant effusion or calcium present.  Cardiac valves: The aortic valve is trileaflet without significant calcification. The mitral valve is normal without significant calcification.  Aorta: Normal caliber without significant disease.  Extra-cardiac findings: See attached radiology report for non-cardiac structures.  IMPRESSION: 1. Coronary calcium score of 0. This was 0 percentile for age-, sex, and race-matched controls.  2. Normal coronary origin with right dominance.  3. Normal coronary arteries.  RECOMMENDATIONS: CAD-RADS 0: No evidence of CAD (0%). Consider non-atherosclerotic causes of chest pain.  Redell Shallow, MD  Electronically Signed: By: Redell Shallow M.D. On: 06/11/2023 17:11          Risk Assessment/Calculations:             Physical Exam:   VS:  BP 121/82 (BP Location: Left  Arm, Patient Position: Sitting, Cuff Size: Normal)   Pulse 64   Ht 5' 7 (1.702 m)   Wt 210 lb (95.3 kg)   SpO2 99%   BMI 32.89 kg/m    Wt Readings from Last 3 Encounters:  07/10/23 210 lb (95.3 kg)  05/28/23 206 lb 3.2 oz (93.5 kg)  02/05/23 204 lb (92.5 kg)    GEN: Well nourished, well developed in no acute distress NECK: No JVD; No carotid bruits CARDIAC: RRR, no murmurs, rubs, gallops RESPIRATORY:  Clear to auscultation without rales, wheezing or rhonchi  ABDOMEN: Soft, non-tender, non-distended EXTREMITIES:  No edema; No deformity   ASSESSMENT AND PLAN: .   Strong family history of CAD-coronary CTA was negative for calcium, Stable with no anginal symptoms. No indication for ischemic evaluation.  Heart healthy diet and regular cardiovascular exercise encouraged.    Palpitations-monitor revealed a few episodes of SVT, recommended that she could take metoprolol  if they were bothersome for her however she already has a low resting heart rate and is not very bothered by them, she just wanted to be evaluated to make sure it was nothing to be concerned with.         Dispo: Follow-up as needed.  Signed, Delon JAYSON Hoover, NP  "

## 2023-07-10 ENCOUNTER — Encounter: Payer: Self-pay | Admitting: Cardiology

## 2023-07-10 ENCOUNTER — Ambulatory Visit: Payer: 59 | Attending: Cardiology | Admitting: Cardiology

## 2023-07-10 VITALS — BP 121/82 | HR 64 | Ht 67.0 in | Wt 210.0 lb

## 2023-07-10 DIAGNOSIS — R002 Palpitations: Secondary | ICD-10-CM

## 2023-07-10 DIAGNOSIS — Z8249 Family history of ischemic heart disease and other diseases of the circulatory system: Secondary | ICD-10-CM | POA: Diagnosis not present

## 2023-07-10 DIAGNOSIS — R079 Chest pain, unspecified: Secondary | ICD-10-CM

## 2023-07-10 NOTE — Patient Instructions (Signed)
Medication Instructions:  Your physician recommends that you continue on your current medications as directed. Please refer to the Current Medication list given to you today.  *If you need a refill on your cardiac medications before your next appointment, please call your pharmacy*   Lab Work: NONE If you have labs (blood work) drawn today and your tests are completely normal, you will receive your results only by: MyChart Message (if you have MyChart) OR A paper copy in the mail If you have any lab test that is abnormal or we need to change your treatment, we will call you to review the results.   Testing/Procedures: NONE   Follow-Up: At [facility], you and your health needs are our priority.  As part of our continuing mission to provide you with exceptional heart care, we have created designated Provider Care Teams.  These Care Teams include your primary Cardiologist (physician) and Advanced Practice Providers (APPs -  Physician Assistants and Nurse Practitioners) who all work together to provide you with the care you need, when you need it.  We recommend signing up for the patient portal called "MyChart".  Sign up information is provided on this After Visit Summary.  MyChart is used to connect with patients for Virtual Visits (Telemedicine).  Patients are able to view lab/test results, encounter notes, upcoming appointments, etc.  Non-urgent messages can be sent to your provider as well.   To learn more about what you can do with MyChart, go to [email or web address].    Your next appointment:  if symptoms worsen or fail to improve     Provider:   Wallis Bamberg, NP Rosalita Levan)    Other Instructions
